# Patient Record
Sex: Male | Born: 1935 | Race: White | Hispanic: No | Marital: Single | Smoking: Former smoker
Health system: Southern US, Community
[De-identification: ages and names within clinical notes are randomized; demographics above are authoritative.]

## PROBLEM LIST (undated history)

## (undated) DIAGNOSIS — N400 Enlarged prostate without lower urinary tract symptoms: Secondary | ICD-10-CM

## (undated) DIAGNOSIS — R0989 Other specified symptoms and signs involving the circulatory and respiratory systems: Secondary | ICD-10-CM

## (undated) DIAGNOSIS — M1990 Unspecified osteoarthritis, unspecified site: Secondary | ICD-10-CM

## (undated) DIAGNOSIS — I10 Essential (primary) hypertension: Secondary | ICD-10-CM

## (undated) DIAGNOSIS — H9190 Unspecified hearing loss, unspecified ear: Secondary | ICD-10-CM

## (undated) DIAGNOSIS — R351 Nocturia: Secondary | ICD-10-CM

## (undated) DIAGNOSIS — G4730 Sleep apnea, unspecified: Secondary | ICD-10-CM

## (undated) HISTORY — PX: PROSTATE BIOPSY: SHX241

## (undated) HISTORY — PX: COLONOSCOPY: SHX174

---

## 2011-12-29 ENCOUNTER — Other Ambulatory Visit: Payer: Self-pay | Admitting: Orthopedic Surgery

## 2011-12-29 MED ORDER — BUPIVACAINE 0.25 % ON-Q PUMP SINGLE CATH 300ML: 300.0000 mL | INJECTION | Status: DC

## 2011-12-29 MED ORDER — DEXAMETHASONE SODIUM PHOSPHATE 10 MG/ML IJ SOLN: 10.0000 mg | Freq: Once | INTRAMUSCULAR | Status: DC

## 2012-03-02 ENCOUNTER — Encounter (HOSPITAL_COMMUNITY): Payer: Self-pay | Admitting: Pharmacy Technician

## 2012-03-08 ENCOUNTER — Encounter (HOSPITAL_COMMUNITY)
Admission: RE | Admit: 2012-03-08 | Discharge: 2012-03-08 | Disposition: A | Discharge status: Home / Self Care | Payer: Medicare Other | Source: Ambulatory Visit | Attending: Orthopedic Surgery | Admitting: Orthopedic Surgery

## 2012-03-08 ENCOUNTER — Ambulatory Visit (HOSPITAL_COMMUNITY)
Admission: RE | Admit: 2012-03-08 | Discharge: 2012-03-08 | Disposition: A | Discharge status: Home / Self Care | Payer: Medicare Other | Source: Ambulatory Visit | Attending: Orthopedic Surgery | Admitting: Orthopedic Surgery

## 2012-03-08 ENCOUNTER — Encounter (HOSPITAL_COMMUNITY): Payer: Self-pay

## 2012-03-08 DIAGNOSIS — Z01812 Encounter for preprocedural laboratory examination: Secondary | ICD-10-CM | POA: Insufficient documentation

## 2012-03-08 DIAGNOSIS — Z01818 Encounter for other preprocedural examination: Secondary | ICD-10-CM | POA: Insufficient documentation

## 2012-03-08 DIAGNOSIS — I10 Essential (primary) hypertension: Secondary | ICD-10-CM | POA: Insufficient documentation

## 2012-03-08 DIAGNOSIS — E119 Type 2 diabetes mellitus without complications: Secondary | ICD-10-CM | POA: Insufficient documentation

## 2012-03-08 HISTORY — DX: Sleep apnea, unspecified: G47.30

## 2012-03-08 HISTORY — DX: Other specified symptoms and signs involving the circulatory and respiratory systems: R09.89

## 2012-03-08 HISTORY — DX: Unspecified hearing loss, unspecified ear: H91.90

## 2012-03-08 HISTORY — DX: Unspecified osteoarthritis, unspecified site: M19.90

## 2012-03-08 HISTORY — DX: Essential (primary) hypertension: I10

## 2012-03-08 HISTORY — DX: Benign prostatic hyperplasia without lower urinary tract symptoms: N40.0

## 2012-03-08 HISTORY — DX: Nocturia: R35.1

## 2012-03-08 LAB — COMPREHENSIVE METABOLIC PANEL
ALT: 18 U/L (ref 0–53)
AST: 27 U/L (ref 0–37)
Albumin: 4.2 g/dL (ref 3.5–5.2)
Alkaline Phosphatase: 80 U/L (ref 39–117)
BUN: 12 mg/dL (ref 6–23)
CO2: 26 mEq/L (ref 19–32)
Calcium: 9.7 mg/dL (ref 8.4–10.5)
Chloride: 95 mEq/L — ABNORMAL LOW (ref 96–112)
Creatinine, Ser: 0.86 mg/dL (ref 0.50–1.35)
GFR calc Af Amer: >90 mL/min (ref >90)
GFR calc non Af Amer: 84 mL/min — ABNORMAL LOW (ref >90)
Glucose, Bld: 110 mg/dL — ABNORMAL HIGH (ref 70–99)
Potassium: 4.6 mEq/L (ref 3.5–5.1)
Sodium: 131 mEq/L — ABNORMAL LOW (ref 135–145)
Total Bilirubin: 0.3 mg/dL (ref 0.3–1.2)
Total Protein: 8 g/dL (ref 6.0–8.3)

## 2012-03-08 LAB — APTT: aPTT: 34 seconds (ref 24–37)

## 2012-03-08 LAB — CBC
HCT: 38.5 % — ABNORMAL LOW (ref 39.0–52.0)
Hemoglobin: 13.2 g/dL (ref 13.0–17.0)
MCV: 93.7 fL (ref 78.0–100.0)
RDW: 12.6 % (ref 11.5–15.5)
WBC: 6.7 10*3/uL (ref 4.0–10.5)

## 2012-03-08 LAB — URINALYSIS, ROUTINE W REFLEX MICROSCOPIC
Bilirubin Urine: NEGATIVE
Hgb urine dipstick: NEGATIVE
Ketones, ur: NEGATIVE mg/dL
Nitrite: NEGATIVE
Protein, ur: NEGATIVE mg/dL
Urobilinogen, UA: 0.2 mg/dL (ref 0.0–1.0)

## 2012-03-08 LAB — PROTIME-INR
INR: 1.06 (ref 0.00–1.49)
Prothrombin Time: 14 seconds (ref 11.6–15.2)

## 2012-03-08 MED ORDER — CHLORHEXIDINE GLUCONATE 4 % EX LIQD
60.0000 mL | Freq: Once | CUTANEOUS | Status: DC
  Filled 2012-03-08: qty 60

## 2012-03-08 NOTE — Patient Instructions (Addendum)
20 Derrick Figueroa  03/08/2012   Your procedure is scheduled on:  03/19/12 at 12:30pm  Report to SHORT STAY DEPT  at 10:00 AM.  Call this number if you have problems the morning of surgery: [phone]   Remember:   Do not eat food or drink liquids AFTER MIDNIGHT  May have clear liquids UNTIL 6 HOURS BEFORE SURGERY (6:30 am)  Clear liquids include soda, tea, black coffee, apple or grape juice, broth.  Take these medicines the morning of surgery with A SIP OF WATER:NONE (TAKE NIGHT TIME MEDS THE NIGHT BEFORE SURGERY AS USUAL)    Do not wear jewelry, make-up or nail polish.  Do not wear lotions, powders, or perfumes.   Do not shave legs or underarms 48 hrs. before surgery (men may shave face)  Do not bring valuables to the hospital.  Contacts, dentures or bridgework may not be worn into surgery.  Leave suitcase in the car. After surgery it may be brought to your room.  For patients admitted to the hospital, checkout time is 11:00 AM the day of discharge.   Patients discharged the day of surgery will not be allowed to drive home.    Special Instructions:   Please read over the following fact sheets that you were given: MRSA  Information / Incentive Spirometer               SHOWER WITH BETASEPT THE NIGHT BEFORE SURGERY AND THE MORNING OF SURGERY

## 2012-03-08 NOTE — Progress Notes (Signed)
03/08/12 1041  OBSTRUCTIVE SLEEP APNEA  Have you ever been diagnosed with sleep apnea through a sleep study? No  Do you snore loudly (loud enough to be heard through closed doors)?  0  Do you often feel tired, fatigued, or sleepy during the daytime? 1  Has anyone observed you stop breathing during your sleep? 0  Do you have, or are you being treated for high blood pressure? 1  BMI more than 35 kg/m2? 0  Age over 74 years old? 1  Neck circumference greater than 40 cm/18 inches? 1  Gender: 1  Obstructive Sleep Apnea Score 5   Score 4 or greater  Updated health history;Results sent to PCP

## 2012-03-18 ENCOUNTER — Other Ambulatory Visit: Payer: Self-pay | Admitting: Orthopedic Surgery

## 2012-03-18 NOTE — H&P (Signed)
Derrick Figueroa  DOB: 03/19/1936 Single / Language: [place] / Race: White / Male  Date of Admission:  03/19/2012  Chief Complaint:  Right Knee Pain  History of Present Illness The patient is a 76 year old male who comes in for a preoperative History and Physical. The patient is scheduled for a right total knee arthroplasty to be performed by Dr. Gus Rankin. [name], MD at [facility] on 03/19/2012. The patient is a 76 year old male who presents today for follow up of their knee. The patient is being followed for their bilateral knee pain. They are now 4 year(s) out from when symptoms began. Symptoms reported today include: pain and swelling. The patient indicates that they have questions or concerns today regarding surgery. Derrick Figueroa was seen for a second opinion about 2 years ago. He had significant arthritis at that time, but his symptoms were minimal so we held off on anything. Unfortunately, his symptoms are much worse now. His pain is occurring at all times. The right knee is far worse than the left. It is limiting what he can and cannot do. Pain is nearly constant. Occasionally he will get swelling. The knee does not give out on him. He's not getting lower extremity weakness or paresthesias with it. The left knee hurts also but to a lesser extent. He is ready to get the right knee replaced. They have been treated conservatively in the past for the above stated problem and despite conservative measures, they continue to have progressive pain and severe functional limitations and dysfunction. They have failed non-operative management. It is felt that they would benefit from undergoing total joint replacement. Risks and benefits of the procedure have been discussed with the patient and they elect to proceed with surgery. There are no active contraindications to surgery such as ongoing infection or rapidly progressive neurological disease.   Allergies BEE STINGS.  06/30/2010 SULFA. 06/30/2010   Family History Cancer. mother and father Hypertension. mother Father. Deceased, Lung Cancer. age 57 Mother. Deceased, Cancer. age 29   Social History Alcohol use. current drinker; drinks beer; 15 or more per week current drinker; drinks beer; 5 to 7 per day Pateint states that he has stopped drinking on several occassions and did not have any problems in the past and has never had any withdrawal symptoms before. Children. 2 Current work status. working part time retired Financial planner (Currently). no Drug/Alcohol Rehab (Previously). no Exercise. Exercises daily; does running / walking Exercises rarely; does running / walking Illicit drug use. no Living situation. live alone Marital status. single Number of flights of stairs before winded. 1 2-3 Pain Contract. no Tobacco / smoke exposure. no Tobacco use. former smoker; smoke(d) 1 1/2 pack(s) per day; uses less than half 1/2 can(s) smokeless per week Post-Surgical Plans. Plan to look into [place] after the hospital stay.   Medication History Lisinopril (20MG  Tablet, Oral) Active. AmLODIPine Besylate (5MG  Tablet, Oral) Active. Atenolol (25MG  Tablet, Oral) Active. Terazosin HCl (5MG  Capsule, Oral) Active. Meloxicam ( Oral) Specific dose unknown - Active. Loratadine (10MG  Tablet, Oral) Active. Fluticasone Propionate ( Nasal) Specific dose unknown - Active. Betamethasone Dipropionate Aug ( External) Specific dose unknown - Active.   Past Surgical History Prostate Biopsy. 3 times (all benign)   Past Medical History High blood pressure Prostate Disease. BPH Impaired Hearing. Left hearing aide Diet-Controlled Diabetes Mellitus   Review of Systems General:Not Present- Chills, Fever, Night Sweats, Appetite Loss, Fatigue, Feeling sick, Weight Gain and Weight Loss. Skin:Not Present-  Itching, Rash, Skin Color Changes, Ulcer, Psoriasis and Change in Hair or  Nails. HEENT:Not Present- Sensitivity to light, Hearing problems, Nose Bleed and Ringing in the Ears. Neck:Not Present- Swollen Glands and Neck Mass. Respiratory:Not Present- Snoring, Chronic Cough, Bloody sputum and Dyspnea. Cardiovascular:Not Present- Shortness of Breath, Chest Pain, Swelling of Extremities, Leg Cramps and Palpitations. Gastrointestinal:Not Present- Bloody Stool, Heartburn, Abdominal Pain, Vomiting, Nausea and Incontinence of Stool. Male Genitourinary:Not Present- Blood in Urine, Frequency, Incontinence and Nocturia. Musculoskeletal:Present- Joint Swelling and Joint Pain. Not Present- Muscle Weakness, Muscle Pain, Joint Stiffness and Back Pain. Neurological:Not Present- Tingling, Numbness, Burning, Tremor, Headaches and Dizziness. Psychiatric:Not Present- Anxiety, Depression and Memory Loss. Endocrine:Not Present- Cold Intolerance, Heat Intolerance, Excessive hunger and Excessive Thirst. Hematology:Not Present- Abnormal Bleeding, Anemia, Blood Clots and Easy Bruising.   Vitals Weight: 202 lb Height: 69 in Body Surface Area: 2.11 m Body Mass Index: 29.83 kg/m Pulse: 72 (Regular) Resp.: 14 (Unlabored) BP: 150/62 (Sitting, Right Arm, Standard)   Physical Exam The physical exam findings are as follows:   General Mental Status - Alert, cooperative and good historian. General Appearance- pleasant. Not in acute distress. Orientation- Oriented X3. Build & Nutrition- Well nourished and Well developed.   Head and Neck Head- normocephalic, atraumatic . Neck Global Assessment- supple. no bruit auscultated on the right and no bruit auscultated on the left.   Eye Pupil- Bilateral- Regular and Round. Motion- Bilateral- EOMI. wears glasses part of the time, mostly reading  Chest and Lung Exam Auscultation: Breath sounds:- clear at anterior chest wall and - clear at posterior chest wall. Adventitious sounds:- No Adventitious  sounds.   Cardiovascular Auscultation:Rhythm- Regular rate and rhythm. Heart Sounds- S1 WNL and S2 WNL. Murmurs & Other Heart Sounds:Auscultation of the heart reveals - No Murmurs.   Abdomen Inspection:Contour- Generalized moderate distention. Palpation/Percussion:Tenderness- Abdomen is non-tender to palpation. Rigidity (guarding)- Abdomen is soft. Auscultation:Auscultation of the abdomen reveals - Bowel sounds normal.   Male Genitourinary Not done, not pertinent to present illness  Musculoskeletal On exam, he's alert and oriented, in no apparent distress. Right hip has normal motion, no discomfort. Right knee has no effusion. Range is about 5-120. There is marked crepitus on ROM. He's got slight varus. He's tender, medial greater than lateral, with no instability.  RADIOGRAPHS: AP of both knees and lateral show severe end stage arthritis and right knee bone on bone medial and patellofemoral with tibial subluxation and varus deformity. He has left knee arthritis but to a lesser degree.  Assessment & Plan Osteoarthritis Right Knee  Note: Patient is for a Right Total Knee Repalcement by Dr. Lequita Halt.  Patient is a current drinker; drinks beer; 5 to 7 per day. Pateint states that he has stopped drinking on several occassions and did not have any problems in the past and has never had any withdrawal symptoms before.  Plan is to go to [facility] after the hospital stay.  PCP - Dr. Sherryll Burger  Signed electronically by Roberts Gaudy, PA-C

## 2012-03-19 ENCOUNTER — Encounter (HOSPITAL_COMMUNITY): Payer: Self-pay | Admitting: *Deleted

## 2012-03-19 ENCOUNTER — Encounter (HOSPITAL_COMMUNITY)
Admission: RE | Disposition: A | Discharge status: Skilled Nursing Facility | Payer: Self-pay | Source: Ambulatory Visit | Attending: Orthopedic Surgery

## 2012-03-19 ENCOUNTER — Ambulatory Visit (HOSPITAL_COMMUNITY): Payer: Medicare Other | Admitting: *Deleted

## 2012-03-19 ENCOUNTER — Inpatient Hospital Stay (HOSPITAL_COMMUNITY)
Admission: RE | Admit: 2012-03-19 | Discharge: 2012-03-22 | DRG: 470 | Disposition: A | Discharge status: Skilled Nursing Facility | Payer: Medicare Other | Source: Ambulatory Visit | Attending: Orthopedic Surgery | Admitting: Orthopedic Surgery

## 2012-03-19 DIAGNOSIS — I10 Essential (primary) hypertension: Secondary | ICD-10-CM | POA: Diagnosis present

## 2012-03-19 DIAGNOSIS — Z96659 Presence of unspecified artificial knee joint: Secondary | ICD-10-CM

## 2012-03-19 DIAGNOSIS — G4730 Sleep apnea, unspecified: Secondary | ICD-10-CM | POA: Diagnosis present

## 2012-03-19 DIAGNOSIS — M1710 Unilateral primary osteoarthritis, unspecified knee: Principal | ICD-10-CM | POA: Diagnosis present

## 2012-03-19 DIAGNOSIS — E875 Hyperkalemia: Secondary | ICD-10-CM | POA: Diagnosis not present

## 2012-03-19 DIAGNOSIS — E119 Type 2 diabetes mellitus without complications: Secondary | ICD-10-CM | POA: Diagnosis present

## 2012-03-19 DIAGNOSIS — D62 Acute posthemorrhagic anemia: Secondary | ICD-10-CM

## 2012-03-19 DIAGNOSIS — M179 Osteoarthritis of knee, unspecified: Secondary | ICD-10-CM | POA: Diagnosis present

## 2012-03-19 DIAGNOSIS — E871 Hypo-osmolality and hyponatremia: Secondary | ICD-10-CM | POA: Diagnosis not present

## 2012-03-19 HISTORY — PX: TOTAL KNEE ARTHROPLASTY: SHX125

## 2012-03-19 LAB — ABO/RH: ABO/RH(D): O POS

## 2012-03-19 LAB — TYPE AND SCREEN: Antibody Screen: NEGATIVE

## 2012-03-19 SURGERY — ARTHROPLASTY, KNEE, TOTAL
Anesthesia: General | Site: Knee | Laterality: Right | Wound class: Clean

## 2012-03-19 MED ORDER — 0.9 % SODIUM CHLORIDE (POUR BTL) OPTIME
TOPICAL | Status: DC | PRN
  Administered 2012-03-19: 1000 mL

## 2012-03-19 MED ORDER — MORPHINE SULFATE (PF) 1 MG/ML IV SOLN
INTRAVENOUS | Status: AC
  Filled 2012-03-19: qty 25

## 2012-03-19 MED ORDER — ONDANSETRON HCL 4 MG/2ML IJ SOLN: 4.0000 mg | Freq: Four times a day (QID) | INTRAMUSCULAR | Status: DC | PRN

## 2012-03-19 MED ORDER — TRAMADOL HCL 50 MG PO TABS: 50.0000 mg | ORAL_TABLET | Freq: Four times a day (QID) | ORAL | Status: DC | PRN

## 2012-03-19 MED ORDER — DIPHENHYDRAMINE HCL 12.5 MG/5ML PO ELIX
12.5000 mg | ORAL_SOLUTION | ORAL | Status: DC | PRN
  Administered 2012-03-20: 12.5 mg via ORAL
  Filled 2012-03-19: qty 5

## 2012-03-19 MED ORDER — ONDANSETRON HCL 4 MG PO TABS: 4.0000 mg | ORAL_TABLET | Freq: Four times a day (QID) | ORAL | Status: DC | PRN

## 2012-03-19 MED ORDER — HYDROMORPHONE HCL PF 1 MG/ML IJ SOLN
0.2500 mg | INTRAMUSCULAR | Status: DC | PRN
  Administered 2012-03-19 (×3): 0.5 mg via INTRAVENOUS

## 2012-03-19 MED ORDER — SODIUM CHLORIDE 0.9 % IR SOLN
Status: DC | PRN
  Administered 2012-03-19: 1000 mL

## 2012-03-19 MED ORDER — SODIUM CHLORIDE 0.9 % IV SOLN: INTRAVENOUS | Status: DC

## 2012-03-19 MED ORDER — FLEET ENEMA 7-19 GM/118ML RE ENEM: 1.0000 | ENEMA | Freq: Once | RECTAL | Status: AC | PRN

## 2012-03-19 MED ORDER — CEFAZOLIN SODIUM 1-5 GM-% IV SOLN
1.0000 g | Freq: Four times a day (QID) | INTRAVENOUS | Status: AC
  Administered 2012-03-19 – 2012-03-20 (×2): 1 g via INTRAVENOUS
  Filled 2012-03-19 (×2): qty 50

## 2012-03-19 MED ORDER — LIDOCAINE HCL (CARDIAC) 20 MG/ML IV SOLN
INTRAVENOUS | Status: DC | PRN
  Administered 2012-03-19: 100 mg via INTRAVENOUS

## 2012-03-19 MED ORDER — METHOCARBAMOL 100 MG/ML IJ SOLN
500.0000 mg | Freq: Four times a day (QID) | INTRAVENOUS | Status: DC | PRN
  Administered 2012-03-19 – 2012-03-20 (×2): 500 mg via INTRAVENOUS
  Filled 2012-03-19 (×2): qty 5

## 2012-03-19 MED ORDER — ONDANSETRON HCL 4 MG/2ML IJ SOLN
INTRAMUSCULAR | Status: DC | PRN
  Administered 2012-03-19: 4 mg via INTRAVENOUS

## 2012-03-19 MED ORDER — ACETAMINOPHEN 10 MG/ML IV SOLN
INTRAVENOUS | Status: DC | PRN
  Administered 2012-03-19: 1000 mg via INTRAVENOUS

## 2012-03-19 MED ORDER — BUPIVACAINE 0.25 % ON-Q PUMP SINGLE CATH 300ML
INJECTION | Status: AC
  Filled 2012-03-19: qty 300

## 2012-03-19 MED ORDER — HYDROMORPHONE HCL PF 1 MG/ML IJ SOLN
INTRAMUSCULAR | Status: AC
  Filled 2012-03-19: qty 1

## 2012-03-19 MED ORDER — TRIAMCINOLONE ACETONIDE 0.1 % EX CREA
TOPICAL_CREAM | Freq: Two times a day (BID) | CUTANEOUS | Status: DC | PRN
  Filled 2012-03-19: qty 15

## 2012-03-19 MED ORDER — ATENOLOL 25 MG PO TABS
25.0000 mg | ORAL_TABLET | Freq: Every day | ORAL | Status: DC
  Administered 2012-03-19 – 2012-03-22 (×4): 25 mg via ORAL
  Filled 2012-03-19 (×4): qty 1

## 2012-03-19 MED ORDER — ACETAMINOPHEN 10 MG/ML IV SOLN
1000.0000 mg | Freq: Once | INTRAVENOUS | Status: DC
  Filled 2012-03-19: qty 100

## 2012-03-19 MED ORDER — HYDROMORPHONE HCL PF 1 MG/ML IJ SOLN
INTRAMUSCULAR | Status: DC | PRN
  Administered 2012-03-19 (×4): 0.5 mg via INTRAVENOUS

## 2012-03-19 MED ORDER — BUPIVACAINE 0.25 % ON-Q PUMP SINGLE CATH 300ML
INJECTION | Status: DC | PRN
  Administered 2012-03-19: 300 mL

## 2012-03-19 MED ORDER — RIVAROXABAN 10 MG PO TABS
10.0000 mg | ORAL_TABLET | Freq: Every day | ORAL | Status: DC
  Administered 2012-03-20 – 2012-03-22 (×3): 10 mg via ORAL
  Filled 2012-03-19 (×4): qty 1

## 2012-03-19 MED ORDER — MORPHINE SULFATE (PF) 1 MG/ML IV SOLN
INTRAVENOUS | Status: DC
  Administered 2012-03-19 (×2): via INTRAVENOUS
  Administered 2012-03-19: 16 mg via INTRAVENOUS
  Administered 2012-03-20: 10 mg via INTRAVENOUS
  Administered 2012-03-20: 3 mg via INTRAVENOUS
  Filled 2012-03-19: qty 25

## 2012-03-19 MED ORDER — BUPIVACAINE ON-Q PAIN PUMP (FOR ORDER SET NO CHG)
INJECTION | Status: DC
  Filled 2012-03-19: qty 1

## 2012-03-19 MED ORDER — CEFAZOLIN SODIUM-DEXTROSE 2-3 GM-% IV SOLR
2.0000 g | INTRAVENOUS | Status: AC
  Administered 2012-03-19: 2 g via INTRAVENOUS

## 2012-03-19 MED ORDER — OXYCODONE HCL 5 MG PO TABS
5.0000 mg | ORAL_TABLET | ORAL | Status: DC | PRN
  Administered 2012-03-21 – 2012-03-22 (×6): 10 mg via ORAL
  Filled 2012-03-19 (×6): qty 2

## 2012-03-19 MED ORDER — DIPHENHYDRAMINE HCL 12.5 MG/5ML PO ELIX
12.5000 mg | ORAL_SOLUTION | Freq: Four times a day (QID) | ORAL | Status: DC | PRN
  Filled 2012-03-19: qty 5

## 2012-03-19 MED ORDER — METOCLOPRAMIDE HCL 10 MG PO TABS: 5.0000 mg | ORAL_TABLET | Freq: Three times a day (TID) | ORAL | Status: DC | PRN

## 2012-03-19 MED ORDER — TERAZOSIN HCL 5 MG PO CAPS
5.0000 mg | ORAL_CAPSULE | Freq: Every day | ORAL | Status: DC
  Administered 2012-03-19 – 2012-03-21 (×3): 5 mg via ORAL
  Filled 2012-03-19 (×4): qty 1

## 2012-03-19 MED ORDER — SODIUM CHLORIDE 0.9 % IV SOLN
INTRAVENOUS | Status: DC
  Administered 2012-03-19: 23:00:00 via INTRAVENOUS

## 2012-03-19 MED ORDER — FENTANYL CITRATE 0.05 MG/ML IJ SOLN
INTRAMUSCULAR | Status: DC | PRN
  Administered 2012-03-19 (×2): 50 ug via INTRAVENOUS
  Administered 2012-03-19: 150 ug via INTRAVENOUS
  Administered 2012-03-19: 50 ug via INTRAVENOUS
  Administered 2012-03-19 (×2): 25 ug via INTRAVENOUS
  Administered 2012-03-19 (×2): 50 ug via INTRAVENOUS

## 2012-03-19 MED ORDER — MENTHOL 3 MG MT LOZG
1.0000 | LOZENGE | OROMUCOSAL | Status: DC | PRN
  Filled 2012-03-19: qty 9

## 2012-03-19 MED ORDER — POLYETHYLENE GLYCOL 3350 17 G PO PACK: 17.0000 g | PACK | Freq: Every day | ORAL | Status: DC | PRN

## 2012-03-19 MED ORDER — NALOXONE HCL 0.4 MG/ML IJ SOLN: 0.4000 mg | INTRAMUSCULAR | Status: DC | PRN

## 2012-03-19 MED ORDER — FLUTICASONE PROPIONATE 50 MCG/ACT NA SUSP
2.0000 | Freq: Every day | NASAL | Status: DC
  Administered 2012-03-19 – 2012-03-22 (×4): 2 via NASAL
  Filled 2012-03-19: qty 16

## 2012-03-19 MED ORDER — LORAZEPAM 1 MG PO TABS
1.0000 mg | ORAL_TABLET | Freq: Four times a day (QID) | ORAL | Status: DC | PRN
  Administered 2012-03-20: 1 mg via ORAL
  Filled 2012-03-19: qty 1

## 2012-03-19 MED ORDER — SODIUM CHLORIDE 0.9 % IJ SOLN: 9.0000 mL | INTRAMUSCULAR | Status: DC | PRN

## 2012-03-19 MED ORDER — ACETAMINOPHEN 650 MG RE SUPP: 650.0000 mg | Freq: Four times a day (QID) | RECTAL | Status: DC | PRN

## 2012-03-19 MED ORDER — PROPOFOL 10 MG/ML IV EMUL
INTRAVENOUS | Status: DC | PRN
  Administered 2012-03-19: 150 mg via INTRAVENOUS

## 2012-03-19 MED ORDER — PHENOL 1.4 % MT LIQD
1.0000 | OROMUCOSAL | Status: DC | PRN
  Filled 2012-03-19: qty 177

## 2012-03-19 MED ORDER — DIPHENHYDRAMINE HCL 50 MG/ML IJ SOLN: 12.5000 mg | Freq: Four times a day (QID) | INTRAMUSCULAR | Status: DC | PRN

## 2012-03-19 MED ORDER — DEXAMETHASONE SODIUM PHOSPHATE 4 MG/ML IJ SOLN
INTRAMUSCULAR | Status: DC | PRN
  Administered 2012-03-19: 10 mg via INTRAVENOUS

## 2012-03-19 MED ORDER — ACETAMINOPHEN 10 MG/ML IV SOLN
INTRAVENOUS | Status: AC
  Filled 2012-03-19: qty 100

## 2012-03-19 MED ORDER — DOCUSATE SODIUM 100 MG PO CAPS
100.0000 mg | ORAL_CAPSULE | Freq: Two times a day (BID) | ORAL | Status: DC
  Administered 2012-03-19 – 2012-03-22 (×6): 100 mg via ORAL

## 2012-03-19 MED ORDER — METHOCARBAMOL 500 MG PO TABS: 500.0000 mg | ORAL_TABLET | Freq: Four times a day (QID) | ORAL | Status: DC | PRN

## 2012-03-19 MED ORDER — METOCLOPRAMIDE HCL 5 MG/ML IJ SOLN: 5.0000 mg | Freq: Three times a day (TID) | INTRAMUSCULAR | Status: DC | PRN

## 2012-03-19 MED ORDER — AMLODIPINE BESYLATE 5 MG PO TABS
5.0000 mg | ORAL_TABLET | Freq: Every day | ORAL | Status: DC
  Administered 2012-03-19 – 2012-03-22 (×4): 5 mg via ORAL
  Filled 2012-03-19 (×4): qty 1

## 2012-03-19 MED ORDER — BISACODYL 10 MG RE SUPP: 10.0000 mg | Freq: Every day | RECTAL | Status: DC | PRN

## 2012-03-19 MED ORDER — ACETAMINOPHEN 10 MG/ML IV SOLN
1000.0000 mg | Freq: Four times a day (QID) | INTRAVENOUS | Status: AC
  Administered 2012-03-19 – 2012-03-20 (×4): 1000 mg via INTRAVENOUS
  Filled 2012-03-19 (×5): qty 100

## 2012-03-19 MED ORDER — MIDAZOLAM HCL 5 MG/5ML IJ SOLN
INTRAMUSCULAR | Status: DC | PRN
  Administered 2012-03-19: 2 mg via INTRAVENOUS

## 2012-03-19 MED ORDER — LACTATED RINGERS IV SOLN
INTRAVENOUS | Status: DC
  Administered 2012-03-19 (×2): via INTRAVENOUS
  Administered 2012-03-19: 1000 mL via INTRAVENOUS

## 2012-03-19 MED ORDER — SUCCINYLCHOLINE CHLORIDE 20 MG/ML IJ SOLN
INTRAMUSCULAR | Status: DC | PRN
  Administered 2012-03-19: 100 mg via INTRAVENOUS

## 2012-03-19 MED ORDER — CEFAZOLIN SODIUM-DEXTROSE 2-3 GM-% IV SOLR
INTRAVENOUS | Status: AC
  Filled 2012-03-19: qty 50

## 2012-03-19 MED ORDER — ACETAMINOPHEN 325 MG PO TABS: 650.0000 mg | ORAL_TABLET | Freq: Four times a day (QID) | ORAL | Status: DC | PRN

## 2012-03-19 MED ORDER — LORATADINE 10 MG PO TABS
10.0000 mg | ORAL_TABLET | Freq: Every day | ORAL | Status: DC
  Administered 2012-03-19 – 2012-03-22 (×4): 10 mg via ORAL
  Filled 2012-03-19 (×4): qty 1

## 2012-03-19 SURGICAL SUPPLY — 52 items
BAG ZIPLOCK 12X15 (MISCELLANEOUS) ×2 IMPLANT
BANDAGE ELASTIC 6 VELCRO ST LF (GAUZE/BANDAGES/DRESSINGS) ×2 IMPLANT
BANDAGE ESMARK 6X9 LF (GAUZE/BANDAGES/DRESSINGS) ×1 IMPLANT
BLADE SAG 18X100X1.27 (BLADE) ×2 IMPLANT
BLADE SAW SGTL 11.0X1.19X90.0M (BLADE) ×2 IMPLANT
BNDG ESMARK 6X9 LF (GAUZE/BANDAGES/DRESSINGS) ×2
BOWL SMART MIX CTS (DISPOSABLE) ×2 IMPLANT
CATH KIT ON-Q SILVERSOAK 5IN (CATHETERS) ×2 IMPLANT
CEMENT HV SMART SET (Cement) ×4 IMPLANT
CLOTH BEACON ORANGE TIMEOUT ST (SAFETY) ×2 IMPLANT
CUFF TOURN SGL QUICK 34 (TOURNIQUET CUFF) ×1
CUFF TRNQT CYL 34X4X40X1 (TOURNIQUET CUFF) ×1 IMPLANT
DRAPE EXTREMITY T 121X128X90 (DRAPE) ×2 IMPLANT
DRAPE POUCH INSTRU U-SHP 10X18 (DRAPES) ×2 IMPLANT
DRAPE U-SHAPE 47X51 STRL (DRAPES) ×2 IMPLANT
DRSG ADAPTIC 3X8 NADH LF (GAUZE/BANDAGES/DRESSINGS) ×2 IMPLANT
DRSG PAD ABDOMINAL 8X10 ST (GAUZE/BANDAGES/DRESSINGS) ×2 IMPLANT
DURAPREP 26ML APPLICATOR (WOUND CARE) ×2 IMPLANT
ELECT REM PT RETURN 9FT ADLT (ELECTROSURGICAL) ×2
ELECTRODE REM PT RTRN 9FT ADLT (ELECTROSURGICAL) ×1 IMPLANT
EVACUATOR 1/8 PVC DRAIN (DRAIN) ×2 IMPLANT
FACESHIELD LNG OPTICON STERILE (SAFETY) ×10 IMPLANT
GLOVE BIO SURGEON STRL SZ7.5 (GLOVE) ×4 IMPLANT
GLOVE BIO SURGEON STRL SZ8 (GLOVE) ×2 IMPLANT
GLOVE BIOGEL PI IND STRL 8 (GLOVE) ×3 IMPLANT
GLOVE BIOGEL PI INDICATOR 8 (GLOVE) ×3
GOWN STRL NON-REIN LRG LVL3 (GOWN DISPOSABLE) ×2 IMPLANT
GOWN STRL REIN XL XLG (GOWN DISPOSABLE) ×4 IMPLANT
GRAFT IC CHAMBER 5CC (Bone Implant) ×2 IMPLANT
HANDPIECE INTERPULSE COAX TIP (DISPOSABLE) ×1
IMMOBILIZER KNEE 20 (SOFTGOODS) ×6
IMMOBILIZER KNEE 20 THIGH 36 (SOFTGOODS) ×3 IMPLANT
KIT BASIN OR (CUSTOM PROCEDURE TRAY) ×2 IMPLANT
MANIFOLD NEPTUNE II (INSTRUMENTS) ×2 IMPLANT
NS IRRIG 1000ML POUR BTL (IV SOLUTION) ×2 IMPLANT
PACK TOTAL JOINT (CUSTOM PROCEDURE TRAY) ×2 IMPLANT
PAD ABD 7.5X8 STRL (GAUZE/BANDAGES/DRESSINGS) IMPLANT
PADDING CAST COTTON 6X4 STRL (CAST SUPPLIES) ×4 IMPLANT
POSITIONER SURGICAL ARM (MISCELLANEOUS) ×2 IMPLANT
SET HNDPC FAN SPRY TIP SCT (DISPOSABLE) ×1 IMPLANT
SPONGE GAUZE 4X4 12PLY (GAUZE/BANDAGES/DRESSINGS) ×2 IMPLANT
STRIP CLOSURE SKIN 1/2X4 (GAUZE/BANDAGES/DRESSINGS) ×2 IMPLANT
SUCTION FRAZIER 12FR DISP (SUCTIONS) ×2 IMPLANT
SUT MNCRL AB 4-0 PS2 18 (SUTURE) ×2 IMPLANT
SUT PDS AB 1 CT1 27 (SUTURE) ×6 IMPLANT
SUT VIC AB 2-0 CT1 27 (SUTURE) ×3
SUT VIC AB 2-0 CT1 TAPERPNT 27 (SUTURE) ×3 IMPLANT
SUT VLOC 180 0 24IN GS25 (SUTURE) ×2 IMPLANT
TOWEL OR 17X26 10 PK STRL BLUE (TOWEL DISPOSABLE) ×4 IMPLANT
TRAY FOLEY CATH 14FRSI W/METER (CATHETERS) ×2 IMPLANT
WATER STERILE IRR 1500ML POUR (IV SOLUTION) ×2 IMPLANT
WRAP KNEE MAXI GEL POST OP (GAUZE/BANDAGES/DRESSINGS) ×2 IMPLANT

## 2012-03-19 NOTE — Anesthesia Preprocedure Evaluation (Signed)
Anesthesia Evaluation  Patient identified by MRN, date of birth, ID band Patient awake    Reviewed: Allergy & Precautions, H&P , NPO status , Patient's Chart, lab work & pertinent test results, reviewed documented beta blocker date and time   Airway Mallampati: II TM Distance: >3 FB Neck ROM: Full    Dental  (+) Teeth Intact and Dental Advisory Given   Pulmonary neg pulmonary ROS,  breath sounds clear to auscultation        Cardiovascular hypertension, Pt. on medications Rate:Normal  Denies cardiac symptoms   Neuro/Psych negative neurological ROS  negative psych ROS   GI/Hepatic negative GI ROS, Neg liver ROS,   Endo/Other  Diet controlled  Renal/GU negative Renal ROS  negative genitourinary   Musculoskeletal   Abdominal   Peds negative pediatric ROS (+)  Hematology negative hematology ROS (+)   Anesthesia Other Findings   Reproductive/Obstetrics negative OB ROS                           Anesthesia Physical Anesthesia Plan  ASA: III  Anesthesia Plan: General   Post-op Pain Management:    Induction: Intravenous  Airway Management Planned: Oral ETT  Additional Equipment:   Intra-op Plan:   Post-operative Plan: Extubation in OR  Informed Consent: I have reviewed the patients History and Physical, chart, labs and discussed the procedure including the risks, benefits and alternatives for the proposed anesthesia with the patient or authorized representative who has indicated his/her understanding and acceptance.   Dental advisory given  Plan Discussed with: CRNA and Surgeon  Anesthesia Plan Comments:         Anesthesia Quick Evaluation

## 2012-03-19 NOTE — Transfer of Care (Signed)
Immediate Anesthesia Transfer of Care Note  Patient: Derrick Figueroa  Procedure(s) Performed: Procedure(s) (LRB): TOTAL KNEE ARTHROPLASTY (Right)  Patient Location: PACU  Anesthesia Type: General  Level of Consciousness: awake, alert  and oriented  Airway & Oxygen Therapy: Patient Spontanous Breathing and Patient connected to nasal cannula oxygen  Post-op Assessment: Report given to PACU RN and Post -op Vital signs reviewed and stable  Post vital signs: Reviewed and stable  Complications: No apparent anesthesia complications

## 2012-03-19 NOTE — Op Note (Signed)
Pre-operative diagnosis- Osteoarthritis  Right knee(s)  Post-operative diagnosis- Osteoarthritis Right knee(s)  Procedure-  Right  Total Knee Arthroplasty  Surgeon- Gus Rankin. Haani Bakula, MD  Assistant- Avel Peace, PA-C   Anesthesia-  General EBL-* No blood loss amount entered *  Drains Hemovac  Tourniquet time-  Total Tourniquet Time Documented: Thigh (Right) - 37 minutes   Complications- None  Condition-PACU - hemodynamically stable.   Brief Clinical Note  Derrick Figueroa is a 76 y.o. year old male with end stage OA of his right knee with progressively worsening pain and dysfunction. He has constant pain, with activity and at rest and significant functional deficits with difficulties even with ADLs. He has had extensive non-op management including analgesics, injections of cortisone and viscosupplements, and home exercise program, but remains in significant pain with significant dysfunction. Radiographs show bone on bone arthritis medial and patellofemoral with tibial subluxation. He presents now for right Total Knee Arthroplasty.    Procedure in detail---   The patient is brought into the operating room and positioned supine on the operating table. After successful administration of  General,   a tourniquet is placed high on the  Right thigh(s) and the lower extremity is prepped and draped in the usual sterile fashion. Time out is performed by the operating team and then the  Right lower extremity is wrapped in Esmarch, knee flexed and the tourniquet inflated to 300 mmHg.       A midline incision is made with a ten blade through the subcutaneous tissue to the level of the extensor mechanism. A fresh blade is used to make a medial parapatellar arthrotomy. Soft tissue over the proximal medial tibia is subperiosteally elevated to the joint line with a knife and into the semimembranosus bursa with a Cobb elevator. Soft tissue over the proximal lateral tibia is elevated with attention being paid to  avoiding the patellar tendon on the tibial tubercle. The patella is everted, knee flexed 90 degrees and the ACL and PCL are removed. Findings are bone on bone medial and patellofemoral with large medial osteophytes.        The drill is used to create a starting hole in the distal femur and the canal is thoroughly irrigated with sterile saline to remove the fatty contents. The 5 degree Right  valgus alignment guide is placed into the femoral canal and the distal femoral cutting block is pinned to remove 10 mm off the distal femur. Resection is made with an oscillating saw.      The tibia is subluxed forward and the menisci are removed. The extramedullary alignment guide is placed referencing proximally at the medial aspect of the tibial tubercle and distally along the second metatarsal axis and tibial crest. The block is pinned to remove 2mm off the more deficient medial  side. Resection is made with an oscillating saw. Size 4is the most appropriate size for the tibia and the proximal tibia is prepared with the modular drill and keel punch for that size.      The femoral sizing guide is placed and size 4 narrow is most appropriate. Rotation is marked off the epicondylar axis and confirmed by creating a rectangular flexion gap at 90 degrees. The size 4 cutting block is pinned in this rotation and the anterior, posterior and chamfer cuts are made with the oscillating saw. The intercondylar block is then placed and that cut is made.      Trial size 4 tibial component, trial size 4 narrow posterior stabilized femur  and a 10  mm posterior stabilized rotating platform insert trial is placed. Full extension is achieved with excellent varus/valgus and anterior/posterior balance throughout full range of motion. The patella is everted and thickness measured to be 24  mm. Free hand resection is taken to 14 mm, a 38 template is placed, lug holes are drilled, trial patella is placed, and it tracks normally. Osteophytes are  removed off the posterior femur with the trial in place. All trials are removed and the cut bone surfaces prepared with pulsatile lavage. Cement is mixed and once ready for implantation, the size 4 tibial implant, size  4 narrow posterior stabilized femoral component, and the size 38 patella are cemented in place and the patella is held with the clamp. The trial insert is placed and the knee held in full extension. All extruded cement is removed and once the cement is hard the permanent 10 mm posterior stabilized rotating platform insert is placed into the tibial tray.      The wound is copiously irrigated with saline solution and the extensor mechanism closed over a hemovac drain with #1 PDS suture. The tourniquet is released for a total tourniquet time of 37  minutes. Flexion against gravity is 140 degrees and the patella tracks normally. Subcutaneous tissue is closed with 2.0 vicryl and subcuticular with running 4.0 Monocryl. The catheter for the Marcaine pain pump is placed and the pump is initiated. The incision is cleaned and dried and steri-strips and a bulky sterile dressing are applied. The limb is placed into a knee immobilizer and the patient is awakened and transported to recovery in stable condition.      Please note that a surgical assistant was a medical necessity for this procedure in order to perform it in a safe and expeditious manner. Surgical assistant was necessary to retract the ligaments and vital neurovascular structures to prevent injury to them and also necessary for proper positioning of the limb to allow for anatomic placement of the prosthesis.   Gus Rankin [name], MD    03/19/2012, 2:01 PM

## 2012-03-19 NOTE — Interval H&P Note (Signed)
History and Physical Interval Note:  03/19/2012 12:45 PM  Derrick Figueroa  has presented today for surgery, with the diagnosis of osteoarthritis right knee   The various methods of treatment have been discussed with the patient and family. After consideration of risks, benefits and other options for treatment, the patient has consented to  Procedure(s) (LRB): TOTAL KNEE ARTHROPLASTY (Right) as a surgical intervention .  The patient's history has been reviewed, patient examined, no change in status, stable for surgery.  I have reviewed the patients' chart and labs.  Questions were answered to the patient's satisfaction.     Loanne Drilling

## 2012-03-19 NOTE — Anesthesia Postprocedure Evaluation (Signed)
Anesthesia Post Note  Patient: Derrick Figueroa  Procedure(s) Performed: Procedure(s) (LRB): TOTAL KNEE ARTHROPLASTY (Right)  Anesthesia type: General  Patient location: PACU  Post pain: Pain level controlled  Post assessment: Post-op Vital signs reviewed  Last Vitals:  Filed Vitals:   03/19/12 1430  BP: 192/81  Pulse: 94  Temp: 36.7 C  Resp: 13    Post vital signs: Reviewed  Level of consciousness: sedated  Complications: No apparent anesthesia complications

## 2012-03-19 NOTE — Preoperative (Signed)
Beta Blockers   Reason not to administer Beta Blockers:Not Applicable 

## 2012-03-19 NOTE — H&P (View-Only) (Signed)
Derrick Figueroa  DOB: 09/06/1936 Single / Language: English / Race: White / Male  Date of Admission:  03/19/2012  Chief Complaint:  Right Knee Pain  History of Present Illness The patient is a 75 year old male who comes in for a preoperative History and Physical. The patient is scheduled for a right total knee arthroplasty to be performed by Dr. Frank V. [name], MD at [facility] on 03/19/2012. The patient is a 75 year old male who presents today for follow up of their knee. The patient is being followed for their bilateral knee pain. They are now 4 year(s) out from when symptoms began. Symptoms reported today include: pain and swelling. The patient indicates that they have questions or concerns today regarding surgery. Derrick Figueroa was seen for a second opinion about 2 years ago. He had significant arthritis at that time, but his symptoms were minimal so we held off on anything. Unfortunately, his symptoms are much worse now. His pain is occurring at all times. The right knee is far worse than the left. It is limiting what he can and cannot do. Pain is nearly constant. Occasionally he will get swelling. The knee does not give out on him. He's not getting lower extremity weakness or paresthesias with it. The left knee hurts also but to a lesser extent. He is ready to get the right knee replaced. They have been treated conservatively in the past for the above stated problem and despite conservative measures, they continue to have progressive pain and severe functional limitations and dysfunction. They have failed non-operative management. It is felt that they would benefit from undergoing total joint replacement. Risks and benefits of the procedure have been discussed with the patient and they elect to proceed with surgery. There are no active contraindications to surgery such as ongoing infection or rapidly progressive neurological disease.   Allergies BEE STINGS.  06/30/2010 SULFA. 06/30/2010   Family History Cancer. mother and father Hypertension. mother Father. Deceased, Lung Cancer. age 65 Mother. Deceased, Cancer. age 82   Social History Alcohol use. current drinker; drinks beer; 15 or more per week current drinker; drinks beer; 5 to 7 per day Pateint states that he has stopped drinking on several occassions and did not have any problems in the past and has never had any withdrawal symptoms before. Children. 2 Current work status. working part time retired Drug/Alcohol Rehab (Currently). no Drug/Alcohol Rehab (Previously). no Exercise. Exercises daily; does running / walking Exercises rarely; does running / walking Illicit drug use. no Living situation. live alone Marital status. single Number of flights of stairs before winded. 1 2-3 Pain Contract. no Tobacco / smoke exposure. no Tobacco use. former smoker; smoke(d) 1 1/2 pack(s) per day; uses less than half 1/2 can(s) smokeless per week Post-Surgical Plans. Plan to look into [facility] after the hospital stay.   Medication History Lisinopril (20MG Tablet, Oral) Active. AmLODIPine Besylate (5MG Tablet, Oral) Active. Atenolol (25MG Tablet, Oral) Active. Terazosin HCl (5MG Capsule, Oral) Active. Meloxicam ( Oral) Specific dose unknown - Active. Loratadine (10MG Tablet, Oral) Active. Fluticasone Propionate ( Nasal) Specific dose unknown - Active. Betamethasone Dipropionate Aug ( External) Specific dose unknown - Active.   Past Surgical History Prostate Biopsy. 3 times (all benign)   Past Medical History High blood pressure Prostate Disease. BPH Impaired Hearing. Left hearing aide Diet-Controlled Diabetes Mellitus   Review of Systems General:Not Present- Chills, Fever, Night Sweats, Appetite Loss, Fatigue, Feeling sick, Weight Gain and Weight Loss. Skin:Not Present-   Itching, Rash, Skin Color Changes, Ulcer, Psoriasis and Change in Hair or  Nails. HEENT:Not Present- Sensitivity to light, Hearing problems, Nose Bleed and Ringing in the Ears. Neck:Not Present- Swollen Glands and Neck Mass. Respiratory:Not Present- Snoring, Chronic Cough, Bloody sputum and Dyspnea. Cardiovascular:Not Present- Shortness of Breath, Chest Pain, Swelling of Extremities, Leg Cramps and Palpitations. Gastrointestinal:Not Present- Bloody Stool, Heartburn, Abdominal Pain, Vomiting, Nausea and Incontinence of Stool. Male Genitourinary:Not Present- Blood in Urine, Frequency, Incontinence and Nocturia. Musculoskeletal:Present- Joint Swelling and Joint Pain. Not Present- Muscle Weakness, Muscle Pain, Joint Stiffness and Back Pain. Neurological:Not Present- Tingling, Numbness, Burning, Tremor, Headaches and Dizziness. Psychiatric:Not Present- Anxiety, Depression and Memory Loss. Endocrine:Not Present- Cold Intolerance, Heat Intolerance, Excessive hunger and Excessive Thirst. Hematology:Not Present- Abnormal Bleeding, Anemia, Blood Clots and Easy Bruising.   Vitals Weight: 202 lb Height: 69 in Body Surface Area: 2.11 m Body Mass Index: 29.83 kg/m Pulse: 72 (Regular) Resp.: 14 (Unlabored) BP: 150/62 (Sitting, Right Arm, Standard)   Physical Exam The physical exam findings are as follows:   General Mental Status - Alert, cooperative and good historian. General Appearance- pleasant. Not in acute distress. Orientation- Oriented X3. Build & Nutrition- Well nourished and Well developed.   Head and Neck Head- normocephalic, atraumatic . Neck Global Assessment- supple. no bruit auscultated on the right and no bruit auscultated on the left.   Eye Pupil- Bilateral- Regular and Round. Motion- Bilateral- EOMI. wears glasses part of the time, mostly reading  Chest and Lung Exam Auscultation: Breath sounds:- clear at anterior chest wall and - clear at posterior chest wall. Adventitious sounds:- No Adventitious  sounds.   Cardiovascular Auscultation:Rhythm- Regular rate and rhythm. Heart Sounds- S1 WNL and S2 WNL. Murmurs & Other Heart Sounds:Auscultation of the heart reveals - No Murmurs.   Abdomen Inspection:Contour- Generalized moderate distention. Palpation/Percussion:Tenderness- Abdomen is non-tender to palpation. Rigidity (guarding)- Abdomen is soft. Auscultation:Auscultation of the abdomen reveals - Bowel sounds normal.   Male Genitourinary Not done, not pertinent to present illness  Musculoskeletal On exam, he's alert and oriented, in no apparent distress. Right hip has normal motion, no discomfort. Right knee has no effusion. Range is about 5-120. There is marked crepitus on ROM. He's got slight varus. He's tender, medial greater than lateral, with no instability.  RADIOGRAPHS: AP of both knees and lateral show severe end stage arthritis and right knee bone on bone medial and patellofemoral with tibial subluxation and varus deformity. He has left knee arthritis but to a lesser degree.  Assessment & Plan Osteoarthritis Right Knee  Note: Patient is for a Right Total Knee Repalcement by Dr. [name].  Patient is a current drinker; drinks beer; 5 to 7 per day. Pateint states that he has stopped drinking on several occassions and did not have any problems in the past and has never had any withdrawal symptoms before.  Plan is to go to [facility] after the hospital stay.  PCP - Dr. Shah  Signed electronically by DREW L Benedicto Capozzi, PA-C  

## 2012-03-20 DIAGNOSIS — E875 Hyperkalemia: Secondary | ICD-10-CM | POA: Diagnosis not present

## 2012-03-20 LAB — BASIC METABOLIC PANEL
BUN: 15 mg/dL (ref 6–23)
CO2: 23 mEq/L (ref 19–32)
CO2: 25 mEq/L (ref 19–32)
Chloride: 94 mEq/L — ABNORMAL LOW (ref 96–112)
Chloride: 93 mEq/L — ABNORMAL LOW (ref 96–112)
Creatinine, Ser: 0.95 mg/dL (ref 0.50–1.35)
GFR calc Af Amer: >90 mL/min (ref >90)
Glucose, Bld: 251 mg/dL — ABNORMAL HIGH (ref 70–99)
Glucose, Bld: 201 mg/dL — ABNORMAL HIGH (ref 70–99)
Potassium: 5.4 mEq/L — ABNORMAL HIGH (ref 3.5–5.1)

## 2012-03-20 LAB — CBC
HCT: 29.2 % — ABNORMAL LOW (ref 39.0–52.0)
Hemoglobin: 10.3 g/dL — ABNORMAL LOW (ref 13.0–17.0)
RBC: 3.13 MIL/uL — ABNORMAL LOW (ref 4.22–5.81)
RDW: 12.9 % (ref 11.5–15.5)
WBC: 8 10*3/uL (ref 4.0–10.5)

## 2012-03-20 LAB — GLUCOSE, CAPILLARY

## 2012-03-20 MED ORDER — MORPHINE SULFATE 2 MG/ML IJ SOLN: 1.0000 mg | INTRAMUSCULAR | Status: DC | PRN

## 2012-03-20 MED ORDER — INSULIN ASPART 100 UNIT/ML ~~LOC~~ SOLN: 0.0000 [IU] | Freq: Three times a day (TID) | SUBCUTANEOUS | Status: DC

## 2012-03-20 MED ORDER — FUROSEMIDE 10 MG/ML IJ SOLN
10.0000 mg | Freq: Once | INTRAMUSCULAR | Status: AC
  Administered 2012-03-20: 10 mg via INTRAVENOUS
  Filled 2012-03-20: qty 1

## 2012-03-20 NOTE — Progress Notes (Signed)
Clinical Social Work Department CLINICAL SOCIAL WORK PLACEMENT NOTE 03/20/2012  Patient:  Derrick Figueroa,Derrick Figueroa  Account Number:  [id number] Admit date:  03/19/2012  Clinical Social Worker:  Cori Razor, LCSW  Date/time:  03/20/2012 11:34 AM  Clinical Social Work is seeking post-discharge placement for this patient at the following level of care:   SKILLED NURSING   (*CSW will update this form in Epic as items are completed)   03/20/2012  Patient/family provided with [facility] Department of Clinical Social Work's list of facilities offering this level of care within the geographic area requested by the patient (or if unable, by the patient's family).  03/20/2012  Patient/family informed of their freedom to choose among providers that offer the needed level of care, that participate in Medicare, Medicaid or managed care program needed by the patient, have an available bed and are willing to accept the patient.    Patient/family informed of [facility]' ownership interest in [facility], as well as of the fact that they are under no obligation to receive care at this facility.  PASARR submitted to EDS on 03/20/2012 PASARR number received from EDS on 03/20/2012  FL2 transmitted to all facilities in geographic area requested by pt/family on  03/20/2012 FL2 transmitted to all facilities within larger geographic area on   Patient informed that his/her managed care company has contracts with or will negotiate with  certain facilities, including the following:     Patient/family informed of bed offers received:   Patient chooses bed at  Physician recommends and patient chooses bed at    Patient to be transferred to  on   Patient to be transferred to facility by   The following physician request were entered in Epic:   Additional Comments:  Cori Razor LCSW [phone]

## 2012-03-20 NOTE — Evaluation (Signed)
Physical Therapy Evaluation Patient Details Name: Derrick Figueroa MRN: [id number] DOB: Dec 09, 1935 Today's Date: 03/20/2012 Time: 7829-5621 PT Time Calculation (min): 14 min  PT Assessment / Plan / Recommendation Clinical Impression  76 yo male s/p R TKA. Mobilizing fairly well. Pt plans to d/c to SNF for rehab.     PT Assessment  Patient needs continued PT services    Follow Up Recommendations  Skilled nursing facility    Barriers to Discharge        Equipment Recommendations  Defer to next venue    Recommendations for Other Services OT consult   Frequency 7X/week    Precautions / Restrictions Precautions Precautions: Fall Restrictions Weight Bearing Restrictions: No RLE Weight Bearing: Weight bearing as tolerated   Pertinent Vitals/Pain       Mobility  Bed Mobility Bed Mobility: Not assessed Details for Bed Mobility Assistance: pt sitting on bsc at start of session Transfers Transfers: Sit to Stand;Stand to Sit Sit to Stand: 4: Min assist;From chair/3-in-1;With upper extremity assist;With armrests Stand to Sit: 4: Min assist;To chair/3-in-1;With upper extremity assist;With armrests Details for Transfer Assistance: VCS safety, technique, hand placement. Assist to rise, stabilize, control descent.  Ambulation/Gait Ambulation/Gait Assistance: 4: Min assist Ambulation Distance (Feet): 70 Feet Assistive device: Rolling walker Ambulation/Gait Assistance Details: VCS safety, technique, sequence, distance from RW. Slow gait speed.  Gait Pattern: Antalgic;Step-to pattern;Decreased stride length;Decreased step length - right;Decreased step length - left    Exercises     PT Diagnosis: Difficulty walking;Acute pain;Abnormality of gait  PT Problem List: Decreased strength;Decreased range of motion;Decreased activity tolerance;Decreased mobility;Pain;Decreased knowledge of use of DME;Decreased knowledge of precautions PT Treatment Interventions: DME instruction;Gait  training;Functional mobility training;Therapeutic activities;Therapeutic exercise;Patient/family education   PT Goals Acute Rehab PT Goals PT Goal Formulation: With patient Time For Goal Achievement: 03/27/12 Potential to Achieve Goals: Good Pt will go Supine/Side to Sit: with supervision PT Goal: Supine/Side to Sit - Progress: Goal set today Pt will go Sit to Supine/Side: with supervision PT Goal: Sit to Supine/Side - Progress: Goal set today Pt will go Sit to Stand: with supervision PT Goal: Sit to Stand - Progress: Goal set today Pt will Ambulate: 51 - 150 feet;with supervision;with least restrictive assistive device PT Goal: Ambulate - Progress: Goal set today Pt will Perform Home Exercise Program: with supervision, verbal cues required/provided PT Goal: Perform Home Exercise Program - Progress: Goal set today  Visit Information  Last PT Received On: 03/20/12 Assistance Needed: +1    Subjective Data  Subjective: "I go at a slow pace, so if you all want to go faster, then just go ahead" Patient Stated Goal: Walk   Prior Functioning  Home Living Lives With: Alone Additional Comments: Planning to d/c to SNF for rehab Prior Function Level of Independence: Independent Communication Communication: No difficulties    Cognition  Overall Cognitive Status: Appears within functional limits for tasks assessed/performed Arousal/Alertness: Awake/alert Orientation Level: Appears intact for tasks assessed Behavior During Session: [facility] for tasks performed    Extremity/Trunk Assessment Right Lower Extremity Assessment RLE ROM/Strength/Tone: Deficits RLE ROM/Strength/Tone Deficits: SLR 2/5, moves ankle well RLE Sensation: WFL - Light Touch Left Lower Extremity Assessment LLE ROM/Strength/Tone: WFL for tasks assessed LLE Sensation: WFL - Light Touch Trunk Assessment Trunk Assessment: Normal   Balance    End of Session PT - End of Session Equipment Utilized During Treatment: Gait  belt Activity Tolerance: Patient tolerated treatment well Patient left: in chair;with call bell/phone within reach CPM Right Knee CPM Right Knee:  Off  GP     [name] [facility] 03/20/2012, 10:49 AM [phone]

## 2012-03-20 NOTE — Progress Notes (Signed)
Clinical Social Work Department BRIEF PSYCHOSOCIAL ASSESSMENT 03/20/2012  Patient:  Derrick Figueroa,Derrick Figueroa     Account Number:  [id number]     Admit date:  03/19/2012  Clinical Social Worker:  Candie Chroman  Date/Time:  03/20/2012 11:19 AM  Referred by:  Physician  Date Referred:  03/20/2012 Referred for  SNF Placement   Other Referral:   Interview type:  Patient Other interview type:    PSYCHOSOCIAL DATA Living Status:  ALONE Admitted from facility:   Level of care:   Primary support name:  Arrie Senate Primary support relationship to patient:  CHILD, ADULT Degree of support available:   supportive    CURRENT CONCERNS Current Concerns  Post-Acute Placement   Other Concerns:    SOCIAL WORK ASSESSMENT / PLAN Pt is a 76 yr old gentleman living at home, alone, prior to hospitalization. CSW met with pt to assist with d/c planning. Pt would like to have ST rehab at [facility] following hospital d/c. SNF contacted to check bed availability. Admissions Coordinator will contact CSW Wed. with admission decision. Pt is aware and agreeable with [facility] search in case there is no opening at [facility] at time of d/c. Bed offers will be provided as received. Message left for pt's son to contact CSW so d/c planning update can be provided. Unable to reach pt's daughter at number listed on demo sheet.   Assessment/plan status:  Psychosocial Support/Ongoing Assessment of Needs Other assessment/ plan:   Information/referral to community resources:   SNF list provided    PATIENT'S/FAMILY'S RESPONSE TO PLAN OF CARE: Pt would like to have his rehab at [facility]. Pt's daughter is a Engineer, civil (consulting) at that facility.    Cori Razor LCSW [phone]

## 2012-03-20 NOTE — Anesthesia Postprocedure Evaluation (Signed)
  Anesthesia Post-op Note  Patient: Derrick Figueroa  Procedure(s) Performed: Procedure(s) (LRB): TOTAL KNEE ARTHROPLASTY (Right)  Patient Location: PACU  Anesthesia Type: General  Level of Consciousness: oriented and sedated  Airway and Oxygen Therapy: Patient Spontanous Breathing and Patient connected to nasal cannula oxygen  Post-op Pain: mild  Post-op Assessment: Post-op Vital signs reviewed, Patient's Cardiovascular Status Stable, Respiratory Function Stable and Patent Airway  Post-op Vital Signs: stable  Complications: No apparent anesthesia complications

## 2012-03-20 NOTE — Progress Notes (Signed)
Physical Therapy Treatment Patient Details Name: Niv Darley MRN: [id number] DOB: 1936/08/30 Today's Date: 03/20/2012 Time: 1400-1436 PT Time Calculation (min): 36 min  PT Assessment / Plan / Recommendation Comments on Treatment Session  Progressing well with mobility.    Follow Up Recommendations  Skilled nursing facility    Barriers to Discharge        Equipment Recommendations  Defer to next venue    Recommendations for Other Services    Frequency 7X/week   Plan Discharge plan remains appropriate    Precautions / Restrictions Precautions Precautions: Fall Restrictions Weight Bearing Restrictions: No RLE Weight Bearing: Weight bearing as tolerated   Pertinent Vitals/Pain     Mobility  Bed Mobility Bed Mobility: Sit to Supine Sit to Supine: 4: Min assist Details for Bed Mobility Assistance: Assist for R LE onto bed.  Transfers Transfers: Sit to Stand;Stand to Sit Sit to Stand: 4: Min assist;With upper extremity assist;From chair/3-in-1 Stand to Sit: 4: Min assist;With upper extremity assist;To bed Details for Transfer Assistance: VCs safety, technique, hand placement. Assist to rise, stabilize, control descent.  Ambulation/Gait Ambulation/Gait Assistance: 4: Min assist Ambulation Distance (Feet): 100 Feet Assistive device: Rolling walker Ambulation/Gait Assistance Details: VCs safety, technique, sequence. Pt somewhat shaky.  Gait Pattern: Step-to pattern;Step-through pattern;Antalgic    Exercises Total Joint Exercises Ankle Circles/Pumps: AROM;Both;10 reps;Supine Quad Sets: AROM;Both;10 reps;Supine;Strengthening Short Arc Quad: AROM;Right;10 reps;Supine;Strengthening Heel Slides: AAROM;Right;10 reps;Supine;Strengthening Hip ABduction/ADduction: AROM;Right;10 reps;Supine;Strengthening Straight Leg Raises: AROM;Right;10 reps;Supine;Strengthening   PT Diagnosis:    PT Problem List:   PT Treatment Interventions:     PT Goals Acute Rehab PT Goals PT Goal: Sit  to Supine/Side - Progress: Progressing toward goal PT Goal: Sit to Stand - Progress: Progressing toward goal PT Goal: Ambulate - Progress: Progressing toward goal PT Goal: Perform Home Exercise Program - Progress: Progressing toward goal  Visit Information  Last PT Received On: 03/20/12 Assistance Needed: +1    Subjective Data  Subjective: "I think I'm doing okay" Patient Stated Goal: get better   Cognition  Overall Cognitive Status: Appears within functional limits for tasks assessed/performed Arousal/Alertness: Awake/alert Orientation Level: Appears intact for tasks assessed Behavior During Session: [facility] for tasks performed    Balance     End of Session PT - End of Session Equipment Utilized During Treatment: Gait belt Activity Tolerance: Patient tolerated treatment well Patient left: in bed;with call bell/phone within reach   GP     [name] [place] 03/20/2012, 3:22 PM [phone]

## 2012-03-20 NOTE — Progress Notes (Signed)
   Subjective: 1 Day Post-Op Procedure(s) (LRB): TOTAL KNEE ARTHROPLASTY (Right) Patient reports pain as mild.   Patient seen in rounds with Dr. Lequita Halt.  Sleeping well but awakens easily. Patient is well, but has had some minor complaints of itching but took Benadryl. We will start therapy today.  Plan is to go Skilled nursing facility after hospital stay.  Wants to look into [facility].  Objective: Vital signs in last 24 hours: Temp:  [96.6 F (35.9 C)-98.2 F (36.8 C)] 97.8 F (36.6 C) (07/09 0520) Pulse Rate:  [56-96] 56  (07/09 0155) Resp:  [10-19] 14  (07/09 0520) BP: (105-192)/(63-81) 118/69 mmHg (07/09 0520) SpO2:  [92 %-100 %] 98 % (07/09 0520) Weight:  [90.266 kg (199 lb)] 90.266 kg (199 lb) (07/08 1545)  Intake/Output from previous day:  Intake/Output Summary (Last 24 hours) at 03/20/12 0759 Last data filed at 03/20/12 0651  Gross per 24 hour  Intake 5132.75 ml  Output   2495 ml  Net 2637.75 ml    Intake/Output this shift:    Labs:  Basename 03/20/12 0435  HGB 10.3*    Basename 03/20/12 0435  WBC 8.0  RBC 3.13*  HCT 29.2*  PLT 173    Basename 03/20/12 0435  NA 125*  K 5.4*  CL 94*  CO2 23  BUN 15  CREATININE 0.93  GLUCOSE 251*  CALCIUM 8.2*   No results found for this basename: LABPT:2,INR:2 in the last 72 hours  EXAM General - Patient is Alert, Appropriate and Oriented Extremity - Neurovascular intact Sensation intact distally Dorsiflexion/Plantar flexion intact Dressing - dressing C/D/I Motor Function - intact, moving foot and toes well on exam.  Hemovac pulled without difficulty.  Past Medical History  Diagnosis Date  . Hypertension Norvasc (parameters), Tenormin, Lisinopril on Hold  . Sinus complaint   . Arthritis   . Enlarged prostate Hytrin  . Nocturia   . Diabetes mellitus Add SSI for postop use.    diet controlled  . Hearing difficulty   . Sleep apnea     STOP BANG SCORE 5    Assessment/Plan: 1 Day Post-Op  Procedure(s) (LRB): TOTAL KNEE ARTHROPLASTY (Right) Principal Problem:  *OA (osteoarthritis) of knee Active Problems:  Postop Hyperkalemia  Will give lasix this morning and recheck K+ level. Advance diet Up with therapy Continue foley due to strict I&O and urinary output monitoring Discharge to SNF - [facility]  DVT Prophylaxis - Xarelto Weight-Bearing as tolerated to right leg Keep foley until tomorrow. No vaccines. D/C PCA Morphine, Change to IV push D/C O2 and Pulse OX and try on Room Air  Patient is a current drinker; drinks beer; 5 to 7 per day. Pateint states that he has stopped drinking on several occassions and did not have any problems in the past and has never had any withdrawal symptoms before. PRN Ativan has been ordered.  PERKINS, ALEXZANDREW 03/20/2012, 7:59 AM

## 2012-03-20 NOTE — Progress Notes (Signed)
Utilization review completed.  

## 2012-03-21 ENCOUNTER — Encounter (HOSPITAL_COMMUNITY): Payer: Self-pay | Admitting: Orthopedic Surgery

## 2012-03-21 DIAGNOSIS — E871 Hypo-osmolality and hyponatremia: Secondary | ICD-10-CM | POA: Diagnosis not present

## 2012-03-21 LAB — BASIC METABOLIC PANEL
Chloride: 98 mEq/L (ref 96–112)
GFR calc Af Amer: >90 mL/min (ref >90)
GFR calc non Af Amer: 85 mL/min — ABNORMAL LOW (ref >90)
Glucose, Bld: 138 mg/dL — ABNORMAL HIGH (ref 70–99)
Potassium: 4.1 mEq/L (ref 3.5–5.1)
Sodium: 133 mEq/L — ABNORMAL LOW (ref 135–145)

## 2012-03-21 LAB — GLUCOSE, CAPILLARY
Glucose-Capillary: 142 mg/dL — ABNORMAL HIGH (ref 70–99)
Glucose-Capillary: 128 mg/dL — ABNORMAL HIGH (ref 70–99)
Glucose-Capillary: 122 mg/dL — ABNORMAL HIGH (ref 70–99)

## 2012-03-21 LAB — CBC
Hemoglobin: 10 g/dL — ABNORMAL LOW (ref 13.0–17.0)
MCHC: 34.5 g/dL (ref 30.0–36.0)
WBC: 7.6 10*3/uL (ref 4.0–10.5)

## 2012-03-21 NOTE — Progress Notes (Signed)
Physical Therapy Treatment Patient Details Name: Derrick Figueroa MRN: [id number] DOB: 1935-10-28 Today's Date: 03/21/2012 Time: 6213-0865 PT Time Calculation (min): 27 min  PT Assessment / Plan / Recommendation Comments on Treatment Session  Progressing well. Some difficulty with knee extension during gait. Practiced quad sets once back to bed. Also placed pilllow under calf to encourage knee extension. Instructed pt to perform 10 quad sets/hr, if able.     Follow Up Recommendations  Skilled nursing facility    Barriers to Discharge        Equipment Recommendations  Defer to next venue    Recommendations for Other Services    Frequency 7X/week   Plan Discharge plan remains appropriate    Precautions / Restrictions Precautions Precautions: Fall Restrictions Weight Bearing Restrictions: No RLE Weight Bearing: Weight bearing as tolerated   Pertinent Vitals/Pain     Mobility  Bed Mobility Bed Mobility: Sit to Supine Sit to Supine: 4: Min assist;HOB flat Details for Bed Mobility Assistance: Assist for R LE onto bed.  Transfers Transfers: Sit to Stand;Stand to Sit Sit to Stand: 4: Min guard;With upper extremity assist;From chair/3-in-1;From toilet Stand to Sit: 4: Min guard;With upper extremity assist;To toilet;To bed Details for Transfer Assistance: VCS safety, technique, hand placement.  Ambulation/Gait Ambulation/Gait Assistance: 4: Min assist Ambulation Distance (Feet): 140 Feet Assistive device: Rolling walker Ambulation/Gait Assistance Details: Assist to stabilize intermittently. VCS safety, sequence, step lenth, heel strike/flat foot with initial contact. Pt having difficulty getting good knee extension in stance phase.  Gait Pattern: Step-through pattern;Antalgic;Right flexed knee in stance    Exercises Total Joint Exercises Ankle Circles/Pumps: AROM;Both;10 reps;Supine Quad Sets: AROM;Strengthening;Right;10 reps;Supine Short Arc Quad: AROM;Strengthening;Right;10  reps;Supine Heel Slides: AAROM;Strengthening;Right;Seated;5 reps Hip ABduction/ADduction: AROM;Strengthening;Right;10 reps;Supine Straight Leg Raises: AROM;Strengthening;Right;10 reps;Supine   PT Diagnosis:    PT Problem List:   PT Treatment Interventions:     PT Goals Acute Rehab PT Goals PT Goal: Sit to Supine/Side - Progress: Progressing toward goal PT Goal: Sit to Stand - Progress: Progressing toward goal PT Goal: Ambulate - Progress: Progressing toward goal PT Goal: Perform Home Exercise Program - Progress: Progressing toward goal  Visit Information  Last PT Received On: 03/21/12 Assistance Needed: +1    Subjective Data  Subjective: "It feel better already" Patient Stated Goal: work on straightening knee   Cognition  Overall Cognitive Status: Appears within functional limits for tasks assessed/performed Arousal/Alertness: Awake/alert Orientation Level: Appears intact for tasks assessed Behavior During Session: [facility] for tasks performed    Balance     End of Session PT - End of Session Equipment Utilized During Treatment: Gait belt Activity Tolerance: Patient tolerated treatment well Patient left: in bed;with call bell/phone within reach   GP     [name] [place] 03/21/2012, 2:48 PM [phone]

## 2012-03-21 NOTE — Progress Notes (Signed)
   Subjective: 2 Days Post-Op Procedure(s) (LRB): TOTAL KNEE ARTHROPLASTY (Right) Patient reports pain as mild.   Patient seen in rounds with Dr. Lequita Halt. Patient is well, and has had no acute complaints or problems Plan is to go Skilled nursing facility after hospital stay. Wants to look into [facility].   Objective: Vital signs in last 24 hours: Temp:  [97.8 F (36.6 C)-98.2 F (36.8 C)] 97.8 F (36.6 C) (07/10 0601) Pulse Rate:  [76-84] 76  (07/10 0601) Resp:  [16-18] 17  (07/10 0736) BP: (129-165)/(68-81) 165/71 mmHg (07/10 0927) SpO2:  [95 %-98 %] 95 % (07/10 0601)  Intake/Output from previous day:  Intake/Output Summary (Last 24 hours) at 03/21/12 1013 Last data filed at 03/21/12 0845  Gross per 24 hour  Intake    960 ml  Output   5775 ml  Net  -4815 ml    Intake/Output this shift: Total I/O In: 240 [P.O.:240] Out: 475 [Urine:475]  Labs:  [facility] 03/21/12 0452 03/20/12 0435  HGB 10.0* 10.3*    Basename 03/21/12 0452 03/20/12 0435  WBC 7.6 8.0  RBC 3.10* 3.13*  HCT 29.0* 29.2*  PLT 169 173    Basename 03/21/12 0452 03/20/12 1359  NA 133* 126*  K 4.1 4.6  CL 98 93*  CO2 27 25  BUN 13 18  CREATININE 0.83 0.95  GLUCOSE 138* 201*  CALCIUM 8.5 8.5   No results found for this basename: LABPT:2,INR:2 in the last 72 hours  EXAM General - Patient is Alert, Appropriate and Oriented Extremity - Neurovascular intact Sensation intact distally Dorsiflexion/Plantar flexion intact No cellulitis present Dressing/Incision - clean, dry, no drainage, healing Motor Function - intact, moving foot and toes well on exam.   Past Medical History  Diagnosis Date  . Hypertension   . Sinus complaint   . Arthritis   . Enlarged prostate   . Nocturia   . Diabetes mellitus     diet controlled  . Hearing difficulty   . Sleep apnea     STOP BANG SCORE 5    Assessment/Plan: 2 Days Post-Op Procedure(s) (LRB): TOTAL KNEE ARTHROPLASTY (Right) Principal Problem:  *OA (osteoarthritis) of knee Active Problems:  Postop Hyperkalemia  Postop Hyponatremia   Up with therapy Plan for discharge tomorrow Discharge to SNF  DVT Prophylaxis - Xarelto Weight-Bearing as tolerated to right leg  Patient is a current drinker; drinks beer; 5 to 7 per day. Pateint states that he has stopped drinking on several occassions and did not have any problems in the past and has never had any withdrawal symptoms before.  PRN Ativan has been ordered.  PERKINS, ALEXZANDREW 03/21/2012, 10:13 AM

## 2012-03-21 NOTE — Progress Notes (Signed)
CSW assisting with d/c planning. Pt has accepted a ST SNF bed at [facility]. SNF bed is available Thurs if pt is stable for d/c. CSW will continue to follow to assist with d/c planning to SNF.  Cori Razor LCSW [phone]

## 2012-03-21 NOTE — Progress Notes (Signed)
Order received, chart reviewed, noted plan is for pt to go to SNF. Will defer OT eval to that facility. Acute OT will sign off.  Ignacia Palma, OTR/L [phone] 03/21/2012

## 2012-03-21 NOTE — Progress Notes (Signed)
Physical Therapy Treatment Patient Details Name: Derrick Figueroa MRN: [id number] DOB: Dec 08, 1935 Today's Date: 03/21/2012 Time: 1030-1055 PT Time Calculation (min): 25 min  PT Assessment / Plan / Recommendation Comments on Treatment Session  Continuing to progress well.     Follow Up Recommendations  Skilled nursing facility    Barriers to Discharge        Equipment Recommendations  Defer to next venue    Recommendations for Other Services    Frequency 7X/week   Plan Discharge plan remains appropriate    Precautions / Restrictions Precautions Precautions: Fall Restrictions Weight Bearing Restrictions: No RLE Weight Bearing: Weight bearing as tolerated   Pertinent Vitals/Pain     Mobility  Bed Mobility Bed Mobility: Not assessed Transfers Transfers: Sit to Stand;Stand to Sit Sit to Stand: 4: Min assist;From chair/3-in-1;With upper extremity assist;With armrests Stand to Sit: With armrests;With upper extremity assist;4: Min assist;To chair/3-in-1 Details for Transfer Assistance: VCS safety, technique, hand placement. Assist to rise, stabilize, control descent.  Ambulation/Gait Ambulation/Gait Assistance: 4: Min assist Ambulation Distance (Feet): 140 Feet Assistive device: Rolling walker Ambulation/Gait Assistance Details: Assist to stabilize intermittently. VCS safety, sequence, step length. Good gait speed. Gait Pattern: Step-to pattern;Step-through pattern;Antalgic    Exercises Total Joint Exercises Ankle Circles/Pumps: AROM;Both;10 reps;Supine Quad Sets: AROM;Both;10 reps;Supine;Strengthening Short Arc Quad: AROM;Strengthening;Right;10 reps;Supine Heel Slides: AAROM;Strengthening;Right;Seated;5 reps Hip ABduction/ADduction: AROM;Strengthening;Right;10 reps;Supine Straight Leg Raises: AROM;Strengthening;Right;10 reps;Supine   PT Diagnosis:    PT Problem List:   PT Treatment Interventions:     PT Goals Acute Rehab PT Goals PT Goal: Sit to Stand - Progress:  Progressing toward goal PT Goal: Ambulate - Progress: Progressing toward goal PT Goal: Perform Home Exercise Program - Progress: Progressing toward goal  Visit Information  Last PT Received On: 03/21/12 Assistance Needed: +1    Subjective Data  Subjective: "It's hurting quite a bit now" Patient Stated Goal: Get better   Cognition  Overall Cognitive Status: Appears within functional limits for tasks assessed/performed Arousal/Alertness: Awake/alert Orientation Level: Appears intact for tasks assessed Behavior During Session: [facility] for tasks performed    Balance     End of Session PT - End of Session Equipment Utilized During Treatment: Gait belt;Right knee immobilizer Activity Tolerance: Patient tolerated treatment well Patient left: in chair;with call bell/phone within reach   GP     Derrick Figueroa 03/21/2012, 11:30 AM [phone]

## 2012-03-22 DIAGNOSIS — D62 Acute posthemorrhagic anemia: Secondary | ICD-10-CM

## 2012-03-22 LAB — BASIC METABOLIC PANEL
CO2: 28 mEq/L (ref 19–32)
GFR calc Af Amer: >90 mL/min (ref >90)

## 2012-03-22 LAB — CBC
HCT: 27.7 % — ABNORMAL LOW (ref 39.0–52.0)
Hemoglobin: 9.4 g/dL — ABNORMAL LOW (ref 13.0–17.0)
MCH: 32.4 pg (ref 26.0–34.0)
MCHC: 33.9 g/dL (ref 30.0–36.0)

## 2012-03-22 MED ORDER — ACETAMINOPHEN 325 MG PO TABS: 650.0000 mg | ORAL_TABLET | Freq: Four times a day (QID) | ORAL | Status: AC | PRN

## 2012-03-22 MED ORDER — DSS 100 MG PO CAPS: 100.0000 mg | ORAL_CAPSULE | Freq: Two times a day (BID) | ORAL | Status: AC

## 2012-03-22 MED ORDER — METHOCARBAMOL 500 MG PO TABS: 500.0000 mg | ORAL_TABLET | Freq: Four times a day (QID) | ORAL | Status: AC | PRN

## 2012-03-22 MED ORDER — RIVAROXABAN 10 MG PO TABS: 10.0000 mg | ORAL_TABLET | Freq: Every day | ORAL | Status: DC

## 2012-03-22 MED ORDER — OXYCODONE HCL 5 MG PO TABS: 5.0000 mg | ORAL_TABLET | ORAL | Status: AC | PRN

## 2012-03-22 MED ORDER — POLYETHYLENE GLYCOL 3350 17 G PO PACK: 17.0000 g | PACK | Freq: Every day | ORAL | Status: AC | PRN

## 2012-03-22 MED ORDER — BISACODYL 10 MG RE SUPP: 10.0000 mg | Freq: Every day | RECTAL | Status: AC | PRN

## 2012-03-22 MED ORDER — TRAMADOL HCL 50 MG PO TABS: 50.0000 mg | ORAL_TABLET | Freq: Four times a day (QID) | ORAL | Status: AC | PRN

## 2012-03-22 MED ORDER — ONDANSETRON HCL 4 MG PO TABS: 4.0000 mg | ORAL_TABLET | Freq: Four times a day (QID) | ORAL | Status: AC | PRN

## 2012-03-22 NOTE — Progress Notes (Signed)
Pt is ready for d/c at [facility]. Gave the nurse report. Pt is leaving by non-emergency ambulance. No has no signs or symptoms of distress.

## 2012-03-22 NOTE — Progress Notes (Signed)
   Subjective: 3 Days Post-Op Procedure(s) (LRB): TOTAL KNEE ARTHROPLASTY (Right) Patient reports pain as mild.   Patient seen in rounds by Dr. Lequita Halt. Patient is well, and has had no acute complaints or problems Patient is ready to go to rehab at [facility]  Objective: Vital signs in last 24 hours: Temp:  [98.6 F (37 C)-98.8 F (37.1 C)] 98.6 F (37 C) (07/11 0439) Pulse Rate:  [76-86] 86  (07/11 0439) Resp:  [14-18] 17  (07/11 0726) BP: (140-165)/(70-76) 149/71 mmHg (07/11 0439) SpO2:  [93 %-98 %] 93 % (07/11 0439)  Intake/Output from previous day:  Intake/Output Summary (Last 24 hours) at 03/22/12 0727 Last data filed at 03/22/12 0715  Gross per 24 hour  Intake   1080 ml  Output   1576 ml  Net   -496 ml    Intake/Output this shift: Total I/O In: -  Out: 1 [Urine:1]  Labs:  [facility] 03/22/12 0422 03/21/12 0452 03/20/12 0435  HGB 9.4* 10.0* 10.3*    Basename 03/22/12 0422 03/21/12 0452  WBC 5.5 7.6  RBC 2.90* 3.10*  HCT 27.7* 29.0*  PLT 165 169    Basename 03/22/12 0422 03/21/12 0452  NA 130* 133*  K 3.6 4.1  CL 94* 98  CO2 28 27  BUN 12 13  CREATININE 0.88 0.83  GLUCOSE 135* 138*  CALCIUM 8.4 8.5   No results found for this basename: LABPT:2,INR:2 in the last 72 hours  EXAM: General - Patient is Alert, Appropriate and Oriented Extremity - Neurovascular intact Sensation intact distally Dorsiflexion/Plantar flexion intact Incision - clean, dry, no drainage, healing Motor Function - intact, moving foot and toes well on exam.   Assessment/Plan: 3 Days Post-Op Procedure(s) (LRB): TOTAL KNEE ARTHROPLASTY (Right) Procedure(s) (LRB): TOTAL KNEE ARTHROPLASTY (Right) Past Medical History  Diagnosis Date  . Hypertension   . Sinus complaint   . Arthritis   . Enlarged prostate   . Nocturia   . Diabetes mellitus     diet controlled  . Hearing difficulty   . Sleep apnea     STOP BANG SCORE 5   Principal Problem:  *OA (osteoarthritis) of  knee Active Problems:  Postop Hyperkalemia  Postop Hyponatremia  Postop Acute blood loss anemia   Up with therapy Discharge to SNF Diet - Cardiac diet and Diabetic diet Follow up - in 2 weeks Activity - WBAT Disposition - Skilled nursing facility - [facility] Condition Upon Discharge - Good D/C Meds - See DC Summary DVT Prophylaxis - Xarelto  Allisha Harter 03/22/2012, 7:27 AM

## 2012-03-22 NOTE — Discharge Summary (Signed)
Physician Discharge Summary   Patient ID: Derrick Figueroa MRN: [id number] DOB/AGE: [birth year] 76 y.o.  Admit date: 03/19/2012 Discharge date: 03/22/2012  Primary Diagnosis: Osteoarthritis Right Knee  Admission Diagnoses:  Past Medical History  Diagnosis Date  . Hypertension   . Sinus complaint   . Arthritis   . Enlarged prostate   . Nocturia   . Diabetes mellitus     diet controlled  . Hearing difficulty   . Sleep apnea     STOP BANG SCORE 5   Discharge Diagnoses:   Principal Problem:  *OA (osteoarthritis) of knee Active Problems:  Postop Hyperkalemia  Postop Hyponatremia  Postop Acute blood loss anemia  Procedure:  Procedure(s) (LRB): TOTAL KNEE ARTHROPLASTY (Right)   Consults: None  HPI: Derrick Figueroa is a 76 y.o. year old male with end stage OA of his right knee with progressively worsening pain and dysfunction. He has constant pain, with activity and at rest and significant functional deficits with difficulties even with ADLs. He has had extensive non-op management including analgesics, injections of cortisone and viscosupplements, and home exercise program, but remains in significant pain with significant dysfunction. Radiographs show bone on bone arthritis medial and patellofemoral with tibial subluxation. He presents now for right Total Knee Arthroplasty.    Laboratory Data: Hospital Outpatient Visit on 03/08/2012  Component Date Value Range Status  . MRSA, PCR 03/08/2012 NEGATIVE  NEGATIVE Final  . Staphylococcus aureus 03/08/2012 NEGATIVE  NEGATIVE Final   Comment:                                 The Xpert SA Assay (FDA                          approved for NASAL specimens                          only), is one component of                          a comprehensive surveillance                          program.  It is not intended                          to diagnose infection nor to                          guide or monitor treatment.  Marland Kitchen aPTT 03/08/2012 34  24  - 37 seconds Final  . WBC 03/08/2012 6.7  4.0 - 10.5 K/uL Final  . RBC 03/08/2012 4.11* 4.22 - 5.81 MIL/uL Final  . Hemoglobin 03/08/2012 13.2  13.0 - 17.0 g/dL Final  . HCT 40/98/1191 38.5* 39.0 - 52.0 % Final  . MCV 03/08/2012 93.7  78.0 - 100.0 fL Final  . MCH 03/08/2012 32.1  26.0 - 34.0 pg Final  . MCHC 03/08/2012 34.3  30.0 - 36.0 g/dL Final  . RDW 47/82/9562 12.6  11.5 - 15.5 % Final  . Platelets 03/08/2012 225  150 - 400 K/uL Final  . Sodium 03/08/2012 131* 135 - 145 mEq/L Final  . Potassium 03/08/2012 4.6  3.5 - 5.1 mEq/L Final  . Chloride 03/08/2012  95* 96 - 112 mEq/L Final  . CO2 03/08/2012 26  19 - 32 mEq/L Final  . Glucose, Bld 03/08/2012 110* 70 - 99 mg/dL Final  . BUN 16/06/9603 12  6 - 23 mg/dL Final  . Creatinine, Ser 03/08/2012 0.86  0.50 - 1.35 mg/dL Final  . Calcium 54/05/8118 9.7  8.4 - 10.5 mg/dL Final  . Total Protein 03/08/2012 8.0  6.0 - 8.3 g/dL Final  . Albumin 14/78/2956 4.2  3.5 - 5.2 g/dL Final  . AST 21/30/8657 27  0 - 37 U/L Final  . ALT 03/08/2012 18  0 - 53 U/L Final  . Alkaline Phosphatase 03/08/2012 80  39 - 117 U/L Final  . Total Bilirubin 03/08/2012 0.3  0.3 - 1.2 mg/dL Final  . GFR calc non Af Amer 03/08/2012 84* >90 mL/min Final  . GFR calc Af Amer 03/08/2012 >90  >90 mL/min Final   Comment:                                 The eGFR has been calculated                          using the CKD EPI equation.                          This calculation has not been                          validated in all clinical                          situations.                          eGFR's persistently                          <90 mL/min signify                          possible Chronic Kidney Disease.  Marland Kitchen Prothrombin Time 03/08/2012 14.0  11.6 - 15.2 seconds Final  . INR 03/08/2012 1.06  0.00 - 1.49 Final  . Color, Urine 03/08/2012 YELLOW  YELLOW Final  . APPearance 03/08/2012 CLEAR  CLEAR Final  . Specific Gravity, Urine 03/08/2012 1.011  1.005 - 1.030  Final  . pH 03/08/2012 6.5  5.0 - 8.0 Final  . Glucose, UA 03/08/2012 100* NEGATIVE mg/dL Final  . Hgb urine dipstick 03/08/2012 NEGATIVE  NEGATIVE Final  . Bilirubin Urine 03/08/2012 NEGATIVE  NEGATIVE Final  . Ketones, ur 03/08/2012 NEGATIVE  NEGATIVE mg/dL Final  . Protein, ur 84/69/6295 NEGATIVE  NEGATIVE mg/dL Final  . Urobilinogen, UA 03/08/2012 0.2  0.0 - 1.0 mg/dL Final  . Nitrite 28/41/3244 NEGATIVE  NEGATIVE Final  . Leukocytes, UA 03/08/2012 NEGATIVE  NEGATIVE Final   MICROSCOPIC NOT DONE ON URINES WITH NEGATIVE PROTEIN, BLOOD, LEUKOCYTES, NITRITE, OR GLUCOSE <1000 mg/dL.    Basename 03/22/12 0422 03/21/12 0452 03/20/12 0435  HGB 9.4* 10.0* 10.3*    Basename 03/22/12 0422 03/21/12 0452  WBC 5.5 7.6  RBC 2.90* 3.10*  HCT 27.7* 29.0*  PLT 165 169    Basename 03/22/12 0422 03/21/12 0452  NA 130*  133*  K 3.6 4.1  CL 94* 98  CO2 28 27  BUN 12 13  CREATININE 0.88 0.83  GLUCOSE 135* 138*  CALCIUM 8.4 8.5   No results found for this basename: LABPT:2,INR:2 in the last 72 hours  X-Rays:Dg Chest 2 View  03/08/2012  *RADIOLOGY REPORT*  Clinical Data: Preoperative assessment for right total knee replacement, history hypertension, diabetes  CHEST - 2 VIEW  Comparison: None  Findings: Minimal enlargement of cardiac silhouette. Calcified tortuous aorta. Pulmonary vascularity normal. Lungs clear. No pleural effusion or pneumothorax. Minimal levoconvex scoliosis thoracic spine with osseous demineralization.  IMPRESSION: Minimal enlargement of cardiac silhouette. No acute abnormalities.  Original Report Authenticated By: Lollie Marrow, M.D.    EKG: Orders placed during the hospital encounter of 03/08/12  . EKG 12-LEAD  . EKG 12-LEAD     Hospital Course: Patient was admitted to [facility] and taken to the OR and underwent the above state procedure without complications.  Patient tolerated the procedure well and was later transferred to the recovery room and then to the  orthopaedic floor for postoperative care.  They were given PO and IV analgesics for pain control following their surgery.  They were given 24 hours of postoperative antibiotics and started on DVT prophylaxis in the form of Xarelto.   PT and OT were ordered for total joint protocol.  Discharge planning consulted to help with postop disposition and equipment needs.  Patient had a good night on the evening of surgery except for itching and started to get up OOB with therapy on day one.  PCA Morphine was discontinued and they were weaned over to PO meds.  Hemovac drain was pulled without difficulty.  Patient is a current drinker; drinks beer; 5 to 7 per day. Pateint states that he has stopped drinking on several occassions and did not have any problems in the past and has never had any withdrawal symptoms before.  PRN Ativan was ordered.  Continued to work with therapy into day two and was progressing with therapy.  Dressing was changed on day two and the incision was healing well.  By day three, the patient had progressed with therapy and meeting their goals.  Incision was healing well.  Patient was seen in rounds and was ready to go home.  Discharge Medications: Prior to Admission medications   Medication Sig Start Date End Date Taking? Authorizing Provider  acetaminophen (TYLENOL) 325 MG tablet Take 2 tablets (650 mg total) by mouth every 6 (six) hours as needed (or Fever >/= 101). 03/22/12 03/22/13  Alexzandrew Perkins, PA  amLODipine (NORVASC) 5 MG tablet Take 5 mg by mouth daily.    Historical Provider, MD  atenolol (TENORMIN) 25 MG tablet Take 25 mg by mouth daily.    Historical Provider, MD  betamethasone valerate (VALISONE) 0.1 % cream Apply 1 application topically 2 (two) times daily.    Historical Provider, MD  bisacodyl (DULCOLAX) 10 MG suppository Place 1 suppository (10 mg total) rectally daily as needed. 03/22/12 04/01/12  Alexzandrew Perkins, PA  docusate sodium 100 MG CAPS Take 100 mg by mouth 2  (two) times daily. 03/22/12 04/01/12  Alexzandrew Perkins, PA  fluticasone (FLONASE) 50 MCG/ACT nasal spray Place 2 sprays into the nose daily.    Historical Provider, MD  lisinopril (PRINIVIL,ZESTRIL) 20 MG tablet Take 20 mg by mouth 2 (two) times daily.    Historical Provider, MD  loratadine (CLARITIN) 10 MG tablet Take 10 mg by mouth daily.  Historical Provider, MD  methocarbamol (ROBAXIN) 500 MG tablet Take 1 tablet (500 mg total) by mouth every 6 (six) hours as needed. 03/22/12 04/01/12  Alexzandrew Perkins, PA  ondansetron (ZOFRAN) 4 MG tablet Take 1 tablet (4 mg total) by mouth every 6 (six) hours as needed for nausea. 03/22/12 03/29/12  Alexzandrew Perkins, PA  oxyCODONE (OXY IR/ROXICODONE) 5 MG immediate release tablet Take 1-2 tablets (5-10 mg total) by mouth every 4 (four) hours as needed for pain. 03/22/12 04/01/12  Alexzandrew Perkins, PA  polyethylene glycol (MIRALAX / GLYCOLAX) packet Take 17 g by mouth daily as needed. 03/22/12 03/25/12  Alexzandrew Julien Girt, PA  rivaroxaban (XARELTO) 10 MG TABS tablet Take 1 tablet (10 mg total) by mouth daily with breakfast. Take Xarelto for two and a half more weeks, then discontinue Xarelto. 03/22/12   Alexzandrew Julien Girt, PA  terazosin (HYTRIN) 5 MG capsule Take 5 mg by mouth at bedtime.    Historical Provider, MD  traMADol (ULTRAM) 50 MG tablet Take 1-2 tablets (50-100 mg total) by mouth every 6 (six) hours as needed (mild pain). 03/22/12 04/01/12  Alexzandrew Perkins, PA    Diet: Cardiac diet and Diabetic diet Activity:WBAT Follow-up:in 2 weeks Disposition - Skilled nursing facility - [facility] Discharged Condition: good   Discharge Orders    Future Orders Please Complete By Expires   Diet - low sodium heart healthy      Diet Carb Modified      Call MD / Call 911      Comments:   If you experience chest pain or shortness of breath, CALL 911 and be transported to the hospital emergency room.  If you develope a fever above 101 F, pus (white  drainage) or increased drainage or redness at the wound, or calf pain, call your surgeon's office.   Discharge instructions      Comments:   Pick up stool softner and laxative for home. Do not submerge incision under water. May shower. Continue to use ice for pain and swelling from surgery.  Take Xarelto for two and a half more weeks, then discontinue Xarelto.  When discharged from the skilled rehab facility, please have the facility set up the patient's Home Health Physical Therapy prior to being released.  Also provide the patient with their medications at time of release from the facility to include their pain medication, the muscle relaxants, and their blood thinner medication.  If the patient is still at the rehab facility at time of follow up appointment, please also assist the patient in arranging follow up appointment in our office and any transportation needs.   Constipation Prevention      Comments:   Drink plenty of fluids.  Prune juice may be helpful.  You may use a stool softener, such as Colace (over the counter) 100 mg twice a day.  Use MiraLax (over the counter) for constipation as needed.   Increase activity slowly as tolerated      Patient may shower      Comments:   You may shower without a dressing once there is no drainage.  Do not wash over the wound.  If drainage remains, do not shower until drainage stops.   Driving restrictions      Comments:   No driving until released by the physician.   Lifting restrictions      Comments:   No lifting until released by the physician.   TED hose      Comments:   Use stockings (TED hose)  for 3 weeks on both leg(s).  You may remove them at night for sleeping.   Change dressing      Comments:   Change dressing daily with sterile 4 x 4 inch gauze dressing and apply TED hose. Do not submerge the incision under water.   Do not put a pillow under the knee. Place it under the heel.      Do not sit on low chairs, stoools or toilet  seats, as it may be difficult to get up from low surfaces        Medication List  As of 03/22/2012  7:34 AM   STOP taking these medications         calcium carbonate 500 MG chewable tablet      meloxicam 7.5 MG tablet         TAKE these medications         acetaminophen 325 MG tablet   Commonly known as: TYLENOL   Take 2 tablets (650 mg total) by mouth every 6 (six) hours as needed (or Fever >/= 101).      amLODipine 5 MG tablet   Commonly known as: NORVASC   Take 5 mg by mouth daily.      atenolol 25 MG tablet   Commonly known as: TENORMIN   Take 25 mg by mouth daily.      betamethasone valerate 0.1 % cream   Commonly known as: VALISONE   Apply 1 application topically 2 (two) times daily.      bisacodyl 10 MG suppository   Commonly known as: DULCOLAX   Place 1 suppository (10 mg total) rectally daily as needed.      DSS 100 MG Caps   Take 100 mg by mouth 2 (two) times daily.      fluticasone 50 MCG/ACT nasal spray   Commonly known as: FLONASE   Place 2 sprays into the nose daily.      lisinopril 20 MG tablet   Commonly known as: PRINIVIL,ZESTRIL   Take 20 mg by mouth 2 (two) times daily.      loratadine 10 MG tablet   Commonly known as: CLARITIN   Take 10 mg by mouth daily.      methocarbamol 500 MG tablet   Commonly known as: ROBAXIN   Take 1 tablet (500 mg total) by mouth every 6 (six) hours as needed.      ondansetron 4 MG tablet   Commonly known as: ZOFRAN   Take 1 tablet (4 mg total) by mouth every 6 (six) hours as needed for nausea.      oxyCODONE 5 MG immediate release tablet   Commonly known as: Oxy IR/ROXICODONE   Take 1-2 tablets (5-10 mg total) by mouth every 4 (four) hours as needed for pain.      polyethylene glycol packet   Commonly known as: MIRALAX / GLYCOLAX   Take 17 g by mouth daily as needed.      rivaroxaban 10 MG Tabs tablet   Commonly known as: XARELTO   Take 1 tablet (10 mg total) by mouth daily with breakfast. Take Xarelto  for two and a half more weeks, then discontinue Xarelto.      terazosin 5 MG capsule   Commonly known as: HYTRIN   Take 5 mg by mouth at bedtime.      traMADol 50 MG tablet   Commonly known as: ULTRAM   Take 1-2 tablets (50-100 mg total) by mouth every 6 (six) hours as  needed (mild pain).           Follow-up Information    Follow up with Loanne Drilling, MD. Schedule an appointment as soon as possible for a visit in 2 weeks.   Contact information:   [facility] [address] [place] [address] [phone]          Signed: Patrica Duel 03/22/2012, 7:34 AM

## 2012-03-22 NOTE — Progress Notes (Signed)
Physical Therapy Treatment Patient Details Name: Derrick Figueroa MRN: [id number] DOB: 10-16-1935 Today's Date: 03/22/2012 Time: 0840-0900 PT Time Calculation (min): 20 min  PT Assessment / Plan / Recommendation Comments on Treatment Session  DCing to SNF today. Still having some difficulty with R knee extension.     Follow Up Recommendations  Skilled nursing facility    Barriers to Discharge        Equipment Recommendations  Defer to next venue    Recommendations for Other Services    Frequency 7X/week   Plan Discharge plan remains appropriate    Precautions / Restrictions Precautions Precautions: Fall Restrictions Weight Bearing Restrictions: No RLE Weight Bearing: Weight bearing as tolerated DC'd Knee Immobilizer on 03/21/12-pt able to perform SLR.   Pertinent Vitals/Pain     Mobility  Bed Mobility Bed Mobility: Supine to Sit Supine to Sit: 4: Min guard;4: Min assist;HOB elevated;With rails Details for Bed Mobility Assistance: Assist for R LE off bed.  Transfers Transfers: Sit to Stand;Stand to Sit Sit to Stand: 4: Min guard;With upper extremity assist;From bed Stand to Sit: 4: Min guard;To chair/3-in-1;With armrests;With upper extremity assist Details for Transfer Assistance: VCs safety, technique, hand placement. Assist to rise, stabilize.  Ambulation/Gait Ambulation/Gait Assistance: 4: Min guard Ambulation Distance (Feet): 145 Feet Assistive device: Rolling walker Ambulation/Gait Assistance Details: VCs safety, sequence, step length, heel strike/flat foot with initial contact on R side. Good gait speed and stability.  Gait Pattern: Step-through pattern;Right flexed knee in stance;Antalgic    Exercises Total Joint Exercises Ankle Circles/Pumps: AROM;Both;10 reps;Supine Quad Sets: Strengthening;AROM;Right;10 reps;Supine Short Arc Quad: AROM;Right;10 reps;Supine Heel Slides: AAROM;Right;10 reps;Supine Hip ABduction/ADduction: AROM;Right;10 reps;Supine Straight Leg  Raises: Right;10 reps;Supine   PT Diagnosis:    PT Problem List:   PT Treatment Interventions:     PT Goals Acute Rehab PT Goals PT Goal: Supine/Side to Sit - Progress: Progressing toward goal PT Goal: Sit to Stand - Progress: Progressing toward goal PT Goal: Ambulate - Progress: Progressing toward goal PT Goal: Perform Home Exercise Program - Progress: Progressing toward goal  Visit Information  Last PT Received On: 03/22/12 Assistance Needed: +1    Subjective Data  Subjective: "This knee just doesn't want to go straight" Patient Stated Goal: Rehab today. Get R knee straight.   Cognition  Overall Cognitive Status: Appears within functional limits for tasks assessed/performed Arousal/Alertness: Awake/alert Orientation Level: Appears intact for tasks assessed Behavior During Session: [facility] for tasks performed    Balance     End of Session PT - End of Session Equipment Utilized During Treatment: Gait belt Activity Tolerance: Patient tolerated treatment well Patient left: in chair;with call bell/phone within reach CPM Right Knee CPM Right Knee: Off   GP     [name] [facility] 03/22/2012, 9:10 AM [phone]

## 2012-03-23 NOTE — Progress Notes (Signed)
Clinical Social Work Department CLINICAL SOCIAL WORK PLACEMENT NOTE 03/23/2012  Patient:  Derrick Figueroa,Derrick Figueroa  Account Number:  [id number] Admit date:  03/19/2012  Clinical Social Worker:  Cori Razor, LCSW  Date/time:  03/20/2012 11:34 AM  Clinical Social Work is seeking post-discharge placement for this patient at the following level of care:   SKILLED NURSING   (*CSW will update this form in Epic as items are completed)   03/20/2012  Patient/family provided with [facility] Department of Clinical Social Work's list of facilities offering this level of care within the geographic area requested by the patient (or if unable, by the patient's family).  03/20/2012  Patient/family informed of their freedom to choose among providers that offer the needed level of care, that participate in Medicare, Medicaid or managed care program needed by the patient, have an available bed and are willing to accept the patient.    Patient/family informed of [facility]' ownership interest in [facility], as well as of the fact that they are under no obligation to receive care at this facility.  PASARR submitted to EDS on 03/20/2012 PASARR number received from EDS on 03/20/2012  FL2 transmitted to all facilities in geographic area requested by pt/family on  03/20/2012 FL2 transmitted to all facilities within larger geographic area on   Patient informed that his/her managed care company has contracts with or will negotiate with  certain facilities, including the following:     Patient/family informed of bed offers received:  03/21/2012 Patient chooses bed at [facility] Physician recommends and patient chooses bed at    Patient to be transferred to [facility] on  03/22/2012 Patient to be transferred to facility by [facility]  The following physician request were entered in Epic:   Additional Comments:  Cori Razor LCSW [phone]

## 2012-03-23 NOTE — Care Management Note (Signed)
    Page 1 of 2   03/23/2012     9:20:14 AM   CARE MANAGEMENT NOTE 03/23/2012  Patient:  Derrick Figueroa,Derrick Figueroa   Account Number:  [id number]  Date Initiated:  03/22/2012  Documentation initiated by:  Colleen Can  Subjective/Objective Assessment:   dx total knee replacemnt     Action/Plan:   SNF rehab   Anticipated DC Date:     Anticipated DC Plan:  SKILLED NURSING FACILITY  In-house referral  Clinical Social Worker      DC Planning Services  NA      [facility] Choice  NA   Choice offered to / List presented to:  NA   DME arranged  NA      DME agency  NA     HH arranged  NA      HH agency  NA   Status of service:  Completed, signed off Medicare Important Message given?   (If response is "NO", the following Medicare IM given date fields will be blank) Date Medicare IM given:   Date Additional Medicare IM given:    Discharge Disposition:  SKILLED NURSING FACILITY  Per UR Regulation:  Reviewed for med. necessity/level of care/duration of stay  If discussed at Long Length of Stay Meetings, dates discussed:    Comments:

## 2012-07-26 ENCOUNTER — Other Ambulatory Visit: Payer: Self-pay | Admitting: Orthopedic Surgery

## 2012-07-26 MED ORDER — DEXAMETHASONE SODIUM PHOSPHATE 10 MG/ML IJ SOLN: 10.0000 mg | Freq: Once | INTRAMUSCULAR | Status: DC

## 2012-07-26 MED ORDER — BUPIVACAINE 0.25 % ON-Q PUMP SINGLE CATH 300ML: 300.0000 mL | INJECTION | Status: DC

## 2012-07-26 NOTE — Progress Notes (Signed)
Preoperative surgical orders have been place into the Epic hospital system for [facility] on 07/26/2012, 11:29 AM  by Patrica Duel for surgery on 10/01/12.  Preop Total Knee orders including Bupivacaine On-Q pump, IV Tylenol, and IV Decadron as long as there are no contraindications to the above medications. Avel Peace, PA-C

## 2012-09-19 ENCOUNTER — Encounter (HOSPITAL_COMMUNITY): Payer: Self-pay | Admitting: Pharmacy Technician

## 2012-09-24 NOTE — Progress Notes (Addendum)
Office visit note 08/03/12 Dr. Sherryll Burger on chart, stress test 12/03/09 on chart, surgery clearance note Dr. Sherryll Burger on chart, EKG 03/23/12 on EPIC, Chest x-ray 03/08/12 on EPIC

## 2012-09-25 ENCOUNTER — Encounter (HOSPITAL_COMMUNITY)
Admission: RE | Admit: 2012-09-25 | Discharge: 2012-09-25 | Disposition: A | Discharge status: Home / Self Care | Payer: Medicare Other | Source: Ambulatory Visit | Attending: Orthopedic Surgery | Admitting: Orthopedic Surgery

## 2012-09-25 ENCOUNTER — Other Ambulatory Visit (HOSPITAL_COMMUNITY): Payer: Self-pay | Admitting: *Deleted

## 2012-09-25 ENCOUNTER — Encounter (HOSPITAL_COMMUNITY): Payer: Self-pay

## 2012-09-25 LAB — URINALYSIS, ROUTINE W REFLEX MICROSCOPIC
Hgb urine dipstick: NEGATIVE
Leukocytes, UA: NEGATIVE
Specific Gravity, Urine: 1.007 (ref 1.005–1.030)
Urobilinogen, UA: 0.2 mg/dL (ref 0.0–1.0)

## 2012-09-25 LAB — CBC
MCHC: 34.7 g/dL (ref 30.0–36.0)
RDW: 12.4 % (ref 11.5–15.5)

## 2012-09-25 LAB — COMPREHENSIVE METABOLIC PANEL
ALT: 23 U/L (ref 0–53)
Albumin: 3.7 g/dL (ref 3.5–5.2)
Alkaline Phosphatase: 73 U/L (ref 39–117)
BUN: 12 mg/dL (ref 6–23)
Potassium: 4.6 mEq/L (ref 3.5–5.1)
Sodium: 130 mEq/L — ABNORMAL LOW (ref 135–145)
Total Protein: 7.3 g/dL (ref 6.0–8.3)

## 2012-09-25 LAB — APTT: aPTT: 28 seconds (ref 24–37)

## 2012-09-25 LAB — SURGICAL PCR SCREEN: Staphylococcus aureus: NEGATIVE

## 2012-09-25 LAB — PROTIME-INR: Prothrombin Time: 13.4 seconds (ref 11.6–15.2)

## 2012-09-25 NOTE — Progress Notes (Signed)
09/25/12 1139  OBSTRUCTIVE SLEEP APNEA  Have you ever been diagnosed with sleep apnea through a sleep study? No  Do you snore loudly (loud enough to be heard through closed doors)?  0  Do you often feel tired, fatigued, or sleepy during the daytime? 0  Has anyone observed you stop breathing during your sleep? 0  Do you have, or are you being treated for high blood pressure? 1  BMI more than 35 kg/m2? 0  Age over 75 years old? 1  Neck circumference greater than 40 cm/18 inches? 1  Gender: 1  Obstructive Sleep Apnea Score 4   Score 4 or greater  Results sent to PCP

## 2012-09-25 NOTE — Patient Instructions (Signed)
20      Your procedure is scheduled on:  Monday 10/01/2012  Report to [facility] at 0725 AM.  Call this number if you have problems the morning of surgery: [phone]   Remember:             IF YOU USE CPAP,BRING MASK AND TUBING AM OF SURGERY!   Do not eat food or drink liquids AFTER MIDNIGHT!  Take these medicines the morning of surgery with A SIP OF WATER: Claritin, Flonase nasal spray    Do not bring valuables to the hospital.  .  Leave suitcase in the car. After surgery it may be brought to your room.  For patients admitted to the hospital, checkout time is 11:00 AM the day of              Discharge.    DO NOT WEAR JEWELRY , MAKE-UP, LOTIONS,POWDERS,PERFUMES!             WOMEN -DO NOT SHAVE LEGS OR UNDERARMS 12 HRS. BEFORE SURGERY!               MEN MAY SHAVE AS USUAL!             CONTACTS,DENTURES OR BRIDGEWORK, FALSE EYELASHES MAY NOT BE WORN INTO SURGERY!                                           Patients discharged the day of surgery will not be allowed to drive home.If going home the same day of surgery, must have someone stay with youfirst 24 hrs.at home and arrange for someone to drive you home from the              Hospital.              YOUR DRIVER BJ:[id number]   Special Instructions:             Please read over the following fact sheets that you were given:             1. [facility] PREPARING FOR SURGERY SHEET              2.MRSA INFORMATION              3.INCENTIVE SPIROMETRY                                        Telford Nab.Naara Kelty,RN,BSN     [id number]                FAILURE TO FOLLOW THESE INSTRUCTIONS MAY RESULT IN CANCELLATION OF YOUR SURGERY!               Patient Signature:___________________________

## 2012-09-30 ENCOUNTER — Other Ambulatory Visit: Payer: Self-pay | Admitting: Orthopedic Surgery

## 2012-09-30 NOTE — H&P (Signed)
Christianne Dolin  DOB: 05-20-1936 Single / Language: [place] / Race: White Male  Date of Admission:  10/01/2012  Chief complaint:  Left Knee Pain  History of Present Illness The patient is a 77 year old male who comes in for a preoperative History and Physical. The patient is scheduled for a left total knee arthroplasty to be performed by Dr. Gus Rankin. [name], MD at [facility] on 10/01/2012. The patient is a 77 year old male who is post-operative out from right total knee arthroplasty. The patient states that he is doing well at this time. The pain is under excellent control at this time and describe their pain as mild. He is doing well with regards to the right knee. He said the left knee is what is bothering him the most now and he wants to get it fixed. 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 This is the only thing holding him back now. It continues to interfere with his walking and regular activities. He has done well with the right knee and is looking forward to getting the left knee fixed. He is now ready to proceed with surgery. They have been treated conservatively in the past for the above stated problem and despite conservative measures, they continue to have progressive pain and severe functional limitations and dysfunction. They have failed non-operative management including home exercise, medications, and injections. It is felt that they would benefit from undergoing total joint replacement. Risks and benefits of the procedure have been discussed with the patient and they elect to proceed with surgery. There are no active contraindications to surgery such as ongoing infection or rapidly progressive neurological disease.  Problem List Osteoarthritis, Knee (715.96) S/P Right total knee arthroplasty (V43.65)   Allergies SULFA. 06/30/2010 BEE STINGS. 06/30/2010   Family History Cancer. mother and father Hypertension. mother Father.  Deceased, Lung Cancer. age 57 Mother. Deceased, Cancer. age 90   Social History Exercise. Exercises daily; does running / walking Exercises rarely; does running / walking Children. 2 Drug/Alcohol Rehab (Previously). no Drug/Alcohol Rehab (Currently). no Illicit drug use. no Living situation. live alone Current work status. working part time retired Marital status. single Tobacco use. former smoker; smoke(d) 1 1/2 pack(s) per day; uses less than half 1/2 can(s) smokeless per week Number of flights of stairs before winded. 1 2-3 Pain Contract. no Tobacco / smoke exposure. no Alcohol use. current drinker; drinks beer; 15 or more per week current drinker; drinks beer; 5 to 7 per day Pateint states that he has stopped drinking on several occassions and did not have any problems in the past and has never had any withdrawal symptoms before. Post-Surgical Plans. Plan to look into [facility] after the hospital stay. He went there following the previous knee surgery.   Medication History Lisinopril (20MG  Tablet, Oral) Active. AmLODIPine Besylate (5MG  Tablet, Oral) Active. Atenolol (25MG  Tablet, Oral) Active. Terazosin HCl (5MG  Capsule, Oral) Active. Loratadine (10MG  Tablet, Oral) Active. Fluticasone Propionate ( Nasal) Specific dose unknown - Active. Betamethasone Dipropionate Aug ( External) Specific dose unknown - Active. Flomax (0.4MG  Capsule, Oral) Active.   Past Surgical History Prostate Biopsy. 3 times (all benign) Total Knee Replacement - Right. Date: 03/21/2012.   Medical History Diabetes Mellitus, Type II High blood pressure Impaired Hearing. Left hearing aide Prostate Disease. BPH Diet-Controlled Diabetes Mellitus Impaired Vision Hemorrhoids   Review of Systems(DREW L PERKINS, PA-C; 09/11/2012 10:29 AM) General:Not Present- Chills, Fever, Night Sweats, Fatigue, Weight Gain, Weight Loss  and Memory Loss. Skin:Not Present-  Hives, Itching, Rash, Eczema and Lesions. HEENT:Present- Hearing Loss. Not Present- Tinnitus, Headache, Double Vision, Visual Loss and Dentures. Respiratory:Not Present- Shortness of breath with exertion, Shortness of breath at rest, Allergies, Coughing up blood and Chronic Cough. Cardiovascular:Not Present- Chest Pain, Racing/skipping heartbeats, Difficulty Breathing Lying Down, Murmur, Swelling and Palpitations. Gastrointestinal:Not Present- Bloody Stool, Heartburn, Abdominal Pain, Vomiting, Nausea, Constipation, Diarrhea, Difficulty Swallowing, Jaundice and Loss of appetitie. Male Genitourinary:Present- Urinating at Night. Not Present- Urinary frequency, Blood in Urine, Weak urinary stream, Discharge, Flank Pain, Incontinence, Painful Urination, Urgency and Urinary Retention. Musculoskeletal:Present- Joint Pain and Morning Stiffness. Not Present- Muscle Weakness, Muscle Pain, Joint Swelling, Back Pain and Spasms. Neurological:Not Present- Tremor, Dizziness, Blackout spells, Paralysis, Difficulty with balance and Weakness. Psychiatric:Not Present- Insomnia.   Vitals Weight: 195 lb Height: 70 in Weight was reported by patient. Height was reported by patient. Body Surface Area: 2.09 m Body Mass Index: 27.98 kg/m Pulse: 92 (Regular) Resp.: 14 (Unlabored) BP: 142/70 (Sitting, Left Arm, Standard)    Physical Exam The physical exam findings are as follows:  Note: Patient is a 77 year old male with continued knee pain.   General Mental Status - Alert, cooperative and good historian. General Appearance- pleasant. Not in acute distress. Orientation- Oriented X3. Build & Nutrition- Well nourished and Well developed.   Head and Neck Head- normocephalic, atraumatic . Neck Global Assessment- supple. no bruit auscultated on the right and no bruit auscultated on the left.   Eye Pupil- Bilateral- Regular and Round. Motion- Bilateral- EOMI. wears glasses  part of the time, mostly reading  Chest and Lung Exam Auscultation: Breath sounds:- clear at anterior chest wall and - clear at posterior chest wall. Adventitious sounds:- No Adventitious sounds.   Cardiovascular Auscultation:Rhythm- Regular rate and rhythm. Heart Sounds- S1 WNL and S2 WNL. Murmurs & Other Heart Sounds:Auscultation of the heart reveals - No Murmurs.   Abdomen Inspection:Contour- Generalized moderate distention. Palpation/Percussion:Tenderness- Abdomen is non-tender to palpation. Rigidity (guarding)- Abdomen is soft. Auscultation:Auscultation of the abdomen reveals - Bowel sounds normal.   Male Genitourinary Not done, not pertinent to present illness  Musculoskeletal  Well developed male, alert and oriented in no apparent distress. His right knee looks great. His range of motion is 3 to 120 degrees. There is no tenderness or instability. The left knee with no effusion. Range is 5 to 120 with marked crepitus on range of motion. He has medial greater than lateral tenderness with varus deformity and no instability.  Radiographs: x-rays showing severe endstage arthritis of the left knee, bone on bone with tibial subluxation.  Assessment & Plan Osteoarthritis, Knee (715.96)  Note: Plan is for a Left Total Knee Replacement by Dr. Lequita Halt.  Plan is to go to [facility]. He did go to [facility] after his previous knee replacement.  PCP - Dr. Kirstie Peri - Patient has been seen preoperatively and felt to be stable for surgery.  Signed electronically by Roberts Gaudy, PA-C

## 2012-10-01 ENCOUNTER — Encounter (HOSPITAL_COMMUNITY): Payer: Self-pay | Admitting: *Deleted

## 2012-10-01 ENCOUNTER — Inpatient Hospital Stay (HOSPITAL_COMMUNITY)
Admission: RE | Admit: 2012-10-01 | Discharge: 2012-10-04 | DRG: 470 | Disposition: A | Discharge status: Skilled Nursing Facility | Payer: Medicare Other | Source: Ambulatory Visit | Attending: Orthopedic Surgery | Admitting: Orthopedic Surgery

## 2012-10-01 ENCOUNTER — Encounter (HOSPITAL_COMMUNITY)
Admission: RE | Disposition: A | Discharge status: Skilled Nursing Facility | Payer: Self-pay | Source: Ambulatory Visit | Attending: Orthopedic Surgery

## 2012-10-01 ENCOUNTER — Inpatient Hospital Stay (HOSPITAL_COMMUNITY): Payer: Medicare Other | Admitting: Anesthesiology

## 2012-10-01 ENCOUNTER — Encounter (HOSPITAL_COMMUNITY): Payer: Self-pay | Admitting: Anesthesiology

## 2012-10-01 DIAGNOSIS — Z96659 Presence of unspecified artificial knee joint: Secondary | ICD-10-CM

## 2012-10-01 DIAGNOSIS — R339 Retention of urine, unspecified: Secondary | ICD-10-CM | POA: Diagnosis not present

## 2012-10-01 DIAGNOSIS — M1710 Unilateral primary osteoarthritis, unspecified knee: Principal | ICD-10-CM | POA: Diagnosis present

## 2012-10-01 DIAGNOSIS — I10 Essential (primary) hypertension: Secondary | ICD-10-CM | POA: Diagnosis present

## 2012-10-01 DIAGNOSIS — E871 Hypo-osmolality and hyponatremia: Secondary | ICD-10-CM | POA: Diagnosis present

## 2012-10-01 DIAGNOSIS — E119 Type 2 diabetes mellitus without complications: Secondary | ICD-10-CM | POA: Diagnosis present

## 2012-10-01 DIAGNOSIS — E875 Hyperkalemia: Secondary | ICD-10-CM

## 2012-10-01 DIAGNOSIS — Z79899 Other long term (current) drug therapy: Secondary | ICD-10-CM

## 2012-10-01 DIAGNOSIS — D62 Acute posthemorrhagic anemia: Secondary | ICD-10-CM | POA: Diagnosis not present

## 2012-10-01 DIAGNOSIS — H547 Unspecified visual loss: Secondary | ICD-10-CM | POA: Diagnosis present

## 2012-10-01 DIAGNOSIS — Z01812 Encounter for preprocedural laboratory examination: Secondary | ICD-10-CM

## 2012-10-01 HISTORY — PX: TOTAL KNEE ARTHROPLASTY: SHX125

## 2012-10-01 LAB — TYPE AND SCREEN
ABO/RH(D): O POS
Antibody Screen: NEGATIVE

## 2012-10-01 LAB — GLUCOSE, CAPILLARY
Glucose-Capillary: 148 mg/dL — ABNORMAL HIGH (ref 70–99)
Glucose-Capillary: 161 mg/dL — ABNORMAL HIGH (ref 70–99)

## 2012-10-01 SURGERY — ARTHROPLASTY, KNEE, TOTAL
Anesthesia: General | Site: Knee | Laterality: Left | Wound class: Clean

## 2012-10-01 MED ORDER — ONDANSETRON HCL 4 MG PO TABS: 4.0000 mg | ORAL_TABLET | Freq: Four times a day (QID) | ORAL | Status: DC | PRN

## 2012-10-01 MED ORDER — FLUTICASONE PROPIONATE 50 MCG/ACT NA SUSP
2.0000 | Freq: Every day | NASAL | Status: DC
  Administered 2012-10-02 – 2012-10-03 (×2): 2 via NASAL
  Filled 2012-10-01: qty 16

## 2012-10-01 MED ORDER — FENTANYL CITRATE 0.05 MG/ML IJ SOLN
INTRAMUSCULAR | Status: DC | PRN
  Administered 2012-10-01 (×2): 50 ug via INTRAVENOUS
  Administered 2012-10-01: 25 ug via INTRAVENOUS
  Administered 2012-10-01: 75 ug via INTRAVENOUS

## 2012-10-01 MED ORDER — CISATRACURIUM BESYLATE (PF) 10 MG/5ML IV SOLN
INTRAVENOUS | Status: DC | PRN
  Administered 2012-10-01: 3 mg via INTRAVENOUS

## 2012-10-01 MED ORDER — ONDANSETRON HCL 4 MG/2ML IJ SOLN
INTRAMUSCULAR | Status: DC | PRN
  Administered 2012-10-01: 4 mg via INTRAVENOUS

## 2012-10-01 MED ORDER — BISACODYL 10 MG RE SUPP: 10.0000 mg | Freq: Every day | RECTAL | Status: DC | PRN

## 2012-10-01 MED ORDER — HYDROMORPHONE HCL PF 1 MG/ML IJ SOLN
0.2500 mg | INTRAMUSCULAR | Status: DC | PRN
  Administered 2012-10-01 (×2): 0.5 mg via INTRAVENOUS

## 2012-10-01 MED ORDER — LACTATED RINGERS IV SOLN: INTRAVENOUS | Status: DC

## 2012-10-01 MED ORDER — DEXTROSE 5 % IV SOLN
500.0000 mg | Freq: Four times a day (QID) | INTRAVENOUS | Status: DC | PRN
  Administered 2012-10-01 (×2): 500 mg via INTRAVENOUS
  Filled 2012-10-01 (×2): qty 5

## 2012-10-01 MED ORDER — CEFAZOLIN SODIUM-DEXTROSE 2-3 GM-% IV SOLR
2.0000 g | Freq: Four times a day (QID) | INTRAVENOUS | Status: AC
  Administered 2012-10-01 (×2): 2 g via INTRAVENOUS
  Filled 2012-10-01 (×2): qty 50

## 2012-10-01 MED ORDER — LIDOCAINE HCL (CARDIAC) 20 MG/ML IV SOLN
INTRAVENOUS | Status: DC | PRN
  Administered 2012-10-01: 20 mg via INTRAVENOUS

## 2012-10-01 MED ORDER — ATENOLOL 25 MG PO TABS
25.0000 mg | ORAL_TABLET | Freq: Every day | ORAL | Status: DC
  Administered 2012-10-01 – 2012-10-03 (×3): 25 mg via ORAL
  Filled 2012-10-01 (×4): qty 1

## 2012-10-01 MED ORDER — LORATADINE 10 MG PO TABS
10.0000 mg | ORAL_TABLET | Freq: Every day | ORAL | Status: DC
  Administered 2012-10-02 – 2012-10-04 (×3): 10 mg via ORAL
  Filled 2012-10-01 (×3): qty 1

## 2012-10-01 MED ORDER — METHOCARBAMOL 500 MG PO TABS
500.0000 mg | ORAL_TABLET | Freq: Four times a day (QID) | ORAL | Status: DC | PRN
  Administered 2012-10-03 (×2): 500 mg via ORAL
  Filled 2012-10-01 (×2): qty 1

## 2012-10-01 MED ORDER — TRAMADOL HCL 50 MG PO TABS
50.0000 mg | ORAL_TABLET | Freq: Four times a day (QID) | ORAL | Status: DC | PRN
  Administered 2012-10-02: 100 mg via ORAL
  Filled 2012-10-01: qty 2

## 2012-10-01 MED ORDER — METOCLOPRAMIDE HCL 10 MG PO TABS: 5.0000 mg | ORAL_TABLET | Freq: Three times a day (TID) | ORAL | Status: DC | PRN

## 2012-10-01 MED ORDER — POLYETHYLENE GLYCOL 3350 17 G PO PACK
17.0000 g | PACK | Freq: Every day | ORAL | Status: DC | PRN
  Administered 2012-10-02: 17 g via ORAL

## 2012-10-01 MED ORDER — FLEET ENEMA 7-19 GM/118ML RE ENEM: 1.0000 | ENEMA | Freq: Once | RECTAL | Status: AC | PRN

## 2012-10-01 MED ORDER — GLYCOPYRROLATE 0.2 MG/ML IJ SOLN
INTRAMUSCULAR | Status: DC | PRN
  Administered 2012-10-01: 0.4 mg via INTRAVENOUS

## 2012-10-01 MED ORDER — ACETAMINOPHEN 325 MG PO TABS: 650.0000 mg | ORAL_TABLET | Freq: Four times a day (QID) | ORAL | Status: DC | PRN

## 2012-10-01 MED ORDER — TERAZOSIN HCL 5 MG PO CAPS
5.0000 mg | ORAL_CAPSULE | Freq: Every day | ORAL | Status: DC
  Administered 2012-10-01 – 2012-10-03 (×3): 5 mg via ORAL
  Filled 2012-10-01 (×4): qty 1

## 2012-10-01 MED ORDER — PROMETHAZINE HCL 25 MG/ML IJ SOLN: 6.2500 mg | INTRAMUSCULAR | Status: DC | PRN

## 2012-10-01 MED ORDER — DIPHENHYDRAMINE HCL 12.5 MG/5ML PO ELIX: 12.5000 mg | ORAL_SOLUTION | ORAL | Status: DC | PRN

## 2012-10-01 MED ORDER — DEXAMETHASONE SODIUM PHOSPHATE 10 MG/ML IJ SOLN: 10.0000 mg | Freq: Once | INTRAMUSCULAR | Status: AC

## 2012-10-01 MED ORDER — SODIUM CHLORIDE 0.9 % IV SOLN
INTRAVENOUS | Status: DC
  Administered 2012-10-01 – 2012-10-02 (×2): via INTRAVENOUS

## 2012-10-01 MED ORDER — ACETAMINOPHEN 10 MG/ML IV SOLN: 1000.0000 mg | Freq: Once | INTRAVENOUS | Status: DC

## 2012-10-01 MED ORDER — PROPOFOL 10 MG/ML IV EMUL
INTRAVENOUS | Status: DC | PRN
  Administered 2012-10-01: 200 mg via INTRAVENOUS
  Administered 2012-10-01: 20 mg via INTRAVENOUS

## 2012-10-01 MED ORDER — BUPIVACAINE LIPOSOME 1.3 % IJ SUSP
20.0000 mL | Freq: Once | INTRAMUSCULAR | Status: AC
  Administered 2012-10-01: 20 mL
  Filled 2012-10-01: qty 20

## 2012-10-01 MED ORDER — CHLORHEXIDINE GLUCONATE 4 % EX LIQD
60.0000 mL | Freq: Once | CUTANEOUS | Status: DC
  Filled 2012-10-01: qty 60

## 2012-10-01 MED ORDER — ONDANSETRON HCL 4 MG/2ML IJ SOLN: 4.0000 mg | Freq: Four times a day (QID) | INTRAMUSCULAR | Status: DC | PRN

## 2012-10-01 MED ORDER — AMLODIPINE BESYLATE 5 MG PO TABS
5.0000 mg | ORAL_TABLET | Freq: Every day | ORAL | Status: DC
  Administered 2012-10-01 – 2012-10-03 (×3): 5 mg via ORAL
  Filled 2012-10-01 (×4): qty 1

## 2012-10-01 MED ORDER — MENTHOL 3 MG MT LOZG
1.0000 | LOZENGE | OROMUCOSAL | Status: DC | PRN
  Filled 2012-10-01: qty 9

## 2012-10-01 MED ORDER — METOCLOPRAMIDE HCL 5 MG/ML IJ SOLN: 5.0000 mg | Freq: Three times a day (TID) | INTRAMUSCULAR | Status: DC | PRN

## 2012-10-01 MED ORDER — NEOSTIGMINE METHYLSULFATE 1 MG/ML IJ SOLN
INTRAMUSCULAR | Status: DC | PRN
  Administered 2012-10-01: 3 mg via INTRAVENOUS

## 2012-10-01 MED ORDER — LACTATED RINGERS IV SOLN
INTRAVENOUS | Status: DC | PRN
  Administered 2012-10-01: 09:00:00 via INTRAVENOUS

## 2012-10-01 MED ORDER — ACETAMINOPHEN 10 MG/ML IV SOLN
INTRAVENOUS | Status: DC | PRN
  Administered 2012-10-01: 1000 mg via INTRAVENOUS

## 2012-10-01 MED ORDER — SUCCINYLCHOLINE CHLORIDE 20 MG/ML IJ SOLN
INTRAMUSCULAR | Status: DC | PRN
  Administered 2012-10-01: 160 mg via INTRAVENOUS

## 2012-10-01 MED ORDER — DEXAMETHASONE 6 MG PO TABS
10.0000 mg | ORAL_TABLET | Freq: Once | ORAL | Status: AC
  Administered 2012-10-02: 10 mg via ORAL
  Filled 2012-10-01: qty 1

## 2012-10-01 MED ORDER — HYDROMORPHONE HCL PF 1 MG/ML IJ SOLN
INTRAMUSCULAR | Status: DC | PRN
  Administered 2012-10-01 (×4): 0.5 mg via INTRAVENOUS

## 2012-10-01 MED ORDER — CEFAZOLIN SODIUM-DEXTROSE 2-3 GM-% IV SOLR
2.0000 g | INTRAVENOUS | Status: AC
  Administered 2012-10-01: 2 g via INTRAVENOUS

## 2012-10-01 MED ORDER — PHENOL 1.4 % MT LIQD: 1.0000 | OROMUCOSAL | Status: DC | PRN

## 2012-10-01 MED ORDER — OXYCODONE HCL 5 MG PO TABS
5.0000 mg | ORAL_TABLET | ORAL | Status: DC | PRN
  Administered 2012-10-01: 10 mg via ORAL
  Administered 2012-10-01 (×3): 5 mg via ORAL
  Administered 2012-10-02 – 2012-10-04 (×11): 10 mg via ORAL
  Filled 2012-10-01: qty 2
  Filled 2012-10-01: qty 1
  Filled 2012-10-01: qty 2
  Filled 2012-10-01: qty 1
  Filled 2012-10-01 (×10): qty 2
  Filled 2012-10-01: qty 1

## 2012-10-01 MED ORDER — ACETAMINOPHEN 650 MG RE SUPP: 650.0000 mg | Freq: Four times a day (QID) | RECTAL | Status: DC | PRN

## 2012-10-01 MED ORDER — 0.9 % SODIUM CHLORIDE (POUR BTL) OPTIME
TOPICAL | Status: DC | PRN
  Administered 2012-10-01: 1000 mL

## 2012-10-01 MED ORDER — SODIUM CHLORIDE 0.9 % IJ SOLN
INTRAMUSCULAR | Status: DC | PRN
  Administered 2012-10-01: 50 mL

## 2012-10-01 MED ORDER — ACETAMINOPHEN 10 MG/ML IV SOLN
1000.0000 mg | Freq: Four times a day (QID) | INTRAVENOUS | Status: AC
  Administered 2012-10-01 – 2012-10-02 (×4): 1000 mg via INTRAVENOUS
  Filled 2012-10-01 (×6): qty 100

## 2012-10-01 MED ORDER — INSULIN ASPART 100 UNIT/ML ~~LOC~~ SOLN
0.0000 [IU] | Freq: Three times a day (TID) | SUBCUTANEOUS | Status: DC
  Administered 2012-10-01 – 2012-10-02 (×2): 5 [IU] via SUBCUTANEOUS
  Administered 2012-10-02: 3 [IU] via SUBCUTANEOUS
  Administered 2012-10-03: 2 [IU] via SUBCUTANEOUS

## 2012-10-01 MED ORDER — MORPHINE SULFATE 2 MG/ML IJ SOLN: 1.0000 mg | INTRAMUSCULAR | Status: DC | PRN

## 2012-10-01 MED ORDER — SODIUM CHLORIDE 0.9 % IR SOLN
Status: DC | PRN
  Administered 2012-10-01: 1000 mL

## 2012-10-01 MED ORDER — SODIUM CHLORIDE 0.9 % IV SOLN: INTRAVENOUS | Status: DC

## 2012-10-01 MED ORDER — DOCUSATE SODIUM 100 MG PO CAPS
100.0000 mg | ORAL_CAPSULE | Freq: Two times a day (BID) | ORAL | Status: DC
  Administered 2012-10-01 – 2012-10-04 (×6): 100 mg via ORAL

## 2012-10-01 MED ORDER — RIVAROXABAN 10 MG PO TABS
10.0000 mg | ORAL_TABLET | Freq: Every day | ORAL | Status: DC
  Administered 2012-10-02 – 2012-10-04 (×3): 10 mg via ORAL
  Filled 2012-10-01 (×4): qty 1

## 2012-10-01 MED ORDER — EPHEDRINE SULFATE 50 MG/ML IJ SOLN
INTRAMUSCULAR | Status: DC | PRN
  Administered 2012-10-01: 10 mg via INTRAVENOUS

## 2012-10-01 MED ORDER — BUPIVACAINE ON-Q PAIN PUMP (FOR ORDER SET NO CHG)
INJECTION | Status: DC
  Filled 2012-10-01: qty 1

## 2012-10-01 SURGICAL SUPPLY — 52 items
BAG ZIPLOCK 12X15 (MISCELLANEOUS) ×2 IMPLANT
BANDAGE ELASTIC 6 VELCRO ST LF (GAUZE/BANDAGES/DRESSINGS) ×2 IMPLANT
BANDAGE ESMARK 6X9 LF (GAUZE/BANDAGES/DRESSINGS) ×1 IMPLANT
BLADE SAG 18X100X1.27 (BLADE) ×2 IMPLANT
BLADE SAW SGTL 11.0X1.19X90.0M (BLADE) ×2 IMPLANT
BNDG ESMARK 6X9 LF (GAUZE/BANDAGES/DRESSINGS) ×2
BOWL SMART MIX CTS (DISPOSABLE) ×2 IMPLANT
CATH KIT ON-Q SILVERSOAK 5IN (CATHETERS) IMPLANT
CEMENT HV SMART SET (Cement) ×4 IMPLANT
CLOTH BEACON ORANGE TIMEOUT ST (SAFETY) ×2 IMPLANT
CUFF TOURN SGL QUICK 34 (TOURNIQUET CUFF) ×1
CUFF TRNQT CYL 34X4X40X1 (TOURNIQUET CUFF) ×1 IMPLANT
DRAPE EXTREMITY T 121X128X90 (DRAPE) ×2 IMPLANT
DRAPE POUCH INSTRU U-SHP 10X18 (DRAPES) ×2 IMPLANT
DRAPE U-SHAPE 47X51 STRL (DRAPES) ×2 IMPLANT
DRSG ADAPTIC 3X8 NADH LF (GAUZE/BANDAGES/DRESSINGS) ×2 IMPLANT
DRSG PAD ABDOMINAL 8X10 ST (GAUZE/BANDAGES/DRESSINGS) ×2 IMPLANT
DURAPREP 26ML APPLICATOR (WOUND CARE) ×2 IMPLANT
ELECT REM PT RETURN 9FT ADLT (ELECTROSURGICAL) ×2
ELECTRODE REM PT RTRN 9FT ADLT (ELECTROSURGICAL) ×1 IMPLANT
EVACUATOR 1/8 PVC DRAIN (DRAIN) ×2 IMPLANT
FACESHIELD LNG OPTICON STERILE (SAFETY) ×10 IMPLANT
GLOVE BIO SURGEON STRL SZ8 (GLOVE) ×2 IMPLANT
GLOVE BIOGEL PI IND STRL 8 (GLOVE) ×2 IMPLANT
GLOVE BIOGEL PI INDICATOR 8 (GLOVE) ×2
GLOVE ECLIPSE 8.0 STRL XLNG CF (GLOVE) ×2 IMPLANT
GLOVE SURG SS PI 6.5 STRL IVOR (GLOVE) ×4 IMPLANT
GOWN STRL NON-REIN LRG LVL3 (GOWN DISPOSABLE) ×4 IMPLANT
GOWN STRL REIN XL XLG (GOWN DISPOSABLE) ×2 IMPLANT
HANDPIECE INTERPULSE COAX TIP (DISPOSABLE) ×1
IMMOBILIZER KNEE 20 (SOFTGOODS) ×2
IMMOBILIZER KNEE 20 THIGH 36 (SOFTGOODS) ×1 IMPLANT
KIT BASIN OR (CUSTOM PROCEDURE TRAY) ×2 IMPLANT
MANIFOLD NEPTUNE II (INSTRUMENTS) ×2 IMPLANT
NS IRRIG 1000ML POUR BTL (IV SOLUTION) ×2 IMPLANT
PACK TOTAL JOINT (CUSTOM PROCEDURE TRAY) ×2 IMPLANT
PAD ABD 7.5X8 STRL (GAUZE/BANDAGES/DRESSINGS) ×2 IMPLANT
PADDING CAST COTTON 6X4 STRL (CAST SUPPLIES) ×2 IMPLANT
POSITIONER SURGICAL ARM (MISCELLANEOUS) ×2 IMPLANT
SET HNDPC FAN SPRY TIP SCT (DISPOSABLE) ×1 IMPLANT
SPONGE GAUZE 4X4 12PLY (GAUZE/BANDAGES/DRESSINGS) ×2 IMPLANT
STRIP CLOSURE SKIN 1/2X4 (GAUZE/BANDAGES/DRESSINGS) ×2 IMPLANT
SUCTION FRAZIER 12FR DISP (SUCTIONS) ×2 IMPLANT
SUT MNCRL AB 4-0 PS2 18 (SUTURE) ×2 IMPLANT
SUT VIC AB 2-0 CT1 27 (SUTURE) ×3
SUT VIC AB 2-0 CT1 TAPERPNT 27 (SUTURE) ×3 IMPLANT
SUT VLOC 180 0 24IN GS25 (SUTURE) ×2 IMPLANT
SYR 50ML LL SCALE MARK (SYRINGE) ×2 IMPLANT
TOWEL OR 17X26 10 PK STRL BLUE (TOWEL DISPOSABLE) ×4 IMPLANT
TRAY FOLEY CATH 14FRSI W/METER (CATHETERS) ×2 IMPLANT
WATER STERILE IRR 1500ML POUR (IV SOLUTION) ×4 IMPLANT
WRAP KNEE MAXI GEL POST OP (GAUZE/BANDAGES/DRESSINGS) ×2 IMPLANT

## 2012-10-01 NOTE — Plan of Care (Signed)
Problem: Consults Goal: Diagnosis- Total Joint Replacement Primary Total Knee     

## 2012-10-01 NOTE — H&P (View-Only) (Signed)
Derrick Figueroa  DOB: 02/26/1936 Single / Language: English / Race: White Male  Date of Admission:  10/01/2012  Chief complaint:  Left Knee Pain  History of Present Illness The patient is a 77 year old male who comes in for a preoperative History and Physical. The patient is scheduled for a left total knee arthroplasty to be performed by Dr. Frank V. [name], MD at [facility] on 10/01/2012. The patient is a 77 year old male who is post-operative out from right total knee arthroplasty. The patient states that he is doing well at this time. The pain is under excellent control at this time and describe their pain as mild. He is doing well with regards to the right knee. He said the left knee is what is bothering him the most now and he wants to get it fixed. 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 This is the only thing holding him back now. It continues to interfere with his walking and regular activities. He has done well with the right knee and is looking forward to getting the left knee fixed. He is now ready to proceed with surgery. They have been treated conservatively in the past for the above stated problem and despite conservative measures, they continue to have progressive pain and severe functional limitations and dysfunction. They have failed non-operative management including home exercise, medications, and injections. It is felt that they would benefit from undergoing total joint replacement. Risks and benefits of the procedure have been discussed with the patient and they elect to proceed with surgery. There are no active contraindications to surgery such as ongoing infection or rapidly progressive neurological disease.  Problem List Osteoarthritis, Knee (715.96) S/P Right total knee arthroplasty (V43.65)   Allergies SULFA. 06/30/2010 BEE STINGS. 06/30/2010   Family History Cancer. mother and father Hypertension. mother Father.  Deceased, Lung Cancer. age 65 Mother. Deceased, Cancer. age 82   Social History Exercise. Exercises daily; does running / walking Exercises rarely; does running / walking Children. 2 Drug/Alcohol Rehab (Previously). no Drug/Alcohol Rehab (Currently). no Illicit drug use. no Living situation. live alone Current work status. working part time retired Marital status. single Tobacco use. former smoker; smoke(d) 1 1/2 pack(s) per day; uses less than half 1/2 can(s) smokeless per week Number of flights of stairs before winded. 1 2-3 Pain Contract. no Tobacco / smoke exposure. no Alcohol use. current drinker; drinks beer; 15 or more per week current drinker; drinks beer; 5 to 7 per day Pateint states that he has stopped drinking on several occassions and did not have any problems in the past and has never had any withdrawal symptoms before. Post-Surgical Plans. Plan to look into [facility] [place] after the hospital stay. He went there following the previous knee surgery.   Medication History Lisinopril (20MG Tablet, Oral) Active. AmLODIPine Besylate (5MG Tablet, Oral) Active. Atenolol (25MG Tablet, Oral) Active. Terazosin HCl (5MG Capsule, Oral) Active. Loratadine (10MG Tablet, Oral) Active. Fluticasone Propionate ( Nasal) Specific dose unknown - Active. Betamethasone Dipropionate Aug ( External) Specific dose unknown - Active. Flomax (0.4MG Capsule, Oral) Active.   Past Surgical History Prostate Biopsy. 3 times (all benign) Total Knee Replacement - Right. Date: 03/21/2012.   Medical History Diabetes Mellitus, Type II High blood pressure Impaired Hearing. Left hearing aide Prostate Disease. BPH Diet-Controlled Diabetes Mellitus Impaired Vision Hemorrhoids   Review of Systems(DREW L PERKINS, PA-C; 09/11/2012 10:29 AM) General:Not Present- Chills, Fever, Night Sweats, Fatigue, Weight Gain, Weight Loss   and Memory Loss. Skin:Not Present-  Hives, Itching, Rash, Eczema and Lesions. HEENT:Present- Hearing Loss. Not Present- Tinnitus, Headache, Double Vision, Visual Loss and Dentures. Respiratory:Not Present- Shortness of breath with exertion, Shortness of breath at rest, Allergies, Coughing up blood and Chronic Cough. Cardiovascular:Not Present- Chest Pain, Racing/skipping heartbeats, Difficulty Breathing Lying Down, Murmur, Swelling and Palpitations. Gastrointestinal:Not Present- Bloody Stool, Heartburn, Abdominal Pain, Vomiting, Nausea, Constipation, Diarrhea, Difficulty Swallowing, Jaundice and Loss of appetitie. Male Genitourinary:Present- Urinating at Night. Not Present- Urinary frequency, Blood in Urine, Weak urinary stream, Discharge, Flank Pain, Incontinence, Painful Urination, Urgency and Urinary Retention. Musculoskeletal:Present- Joint Pain and Morning Stiffness. Not Present- Muscle Weakness, Muscle Pain, Joint Swelling, Back Pain and Spasms. Neurological:Not Present- Tremor, Dizziness, Blackout spells, Paralysis, Difficulty with balance and Weakness. Psychiatric:Not Present- Insomnia.   Vitals Weight: 195 lb Height: 70 in Weight was reported by patient. Height was reported by patient. Body Surface Area: 2.09 m Body Mass Index: 27.98 kg/m Pulse: 92 (Regular) Resp.: 14 (Unlabored) BP: 142/70 (Sitting, Left Arm, Standard)    Physical Exam The physical exam findings are as follows:  Note: Patient is a 77 year old male with continued knee pain.   General Mental Status - Alert, cooperative and good historian. General Appearance- pleasant. Not in acute distress. Orientation- Oriented X3. Build & Nutrition- Well nourished and Well developed.   Head and Neck Head- normocephalic, atraumatic . Neck Global Assessment- supple. no bruit auscultated on the right and no bruit auscultated on the left.   Eye Pupil- Bilateral- Regular and Round. Motion- Bilateral- EOMI. wears glasses  part of the time, mostly reading  Chest and Lung Exam Auscultation: Breath sounds:- clear at anterior chest wall and - clear at posterior chest wall. Adventitious sounds:- No Adventitious sounds.   Cardiovascular Auscultation:Rhythm- Regular rate and rhythm. Heart Sounds- S1 WNL and S2 WNL. Murmurs & Other Heart Sounds:Auscultation of the heart reveals - No Murmurs.   Abdomen Inspection:Contour- Generalized moderate distention. Palpation/Percussion:Tenderness- Abdomen is non-tender to palpation. Rigidity (guarding)- Abdomen is soft. Auscultation:Auscultation of the abdomen reveals - Bowel sounds normal.   Male Genitourinary Not done, not pertinent to present illness  Musculoskeletal  Well developed male, alert and oriented in no apparent distress. His right knee looks great. His range of motion is 3 to 120 degrees. There is no tenderness or instability. The left knee with no effusion. Range is 5 to 120 with marked crepitus on range of motion. He has medial greater than lateral tenderness with varus deformity and no instability.  Radiographs: x-rays showing severe endstage arthritis of the left knee, bone on bone with tibial subluxation.  Assessment & Plan Osteoarthritis, Knee (715.96)  Note: Plan is for a Left Total Knee Replacement by Dr. [name].  Plan is to go to [facility] [place]. He did go to [facility] after his previous knee replacement.  PCP - Dr. Ashish Shah - Patient has been seen preoperatively and felt to be stable for surgery.  Signed electronically by DREW L PERKINS, PA-C  

## 2012-10-01 NOTE — Interval H&P Note (Signed)
History and Physical Interval Note:  10/01/2012 8:22 AM  Derrick Figueroa  has presented today for surgery, with the diagnosis of Osteoarthritis of the Left Knee  The various methods of treatment have been discussed with the patient and family. After consideration of risks, benefits and other options for treatment, the patient has consented to  Procedure(s) (LRB) with comments: TOTAL KNEE ARTHROPLASTY (Left) as a surgical intervention .  The patient's history has been reviewed, patient examined, no change in status, stable for surgery.  I have reviewed the patient's chart and labs.  Questions were answered to the patient's satisfaction.     Loanne Drilling

## 2012-10-01 NOTE — Anesthesia Preprocedure Evaluation (Signed)
Anesthesia Evaluation  Patient identified by MRN, date of birth, ID band Patient awake    Reviewed: Allergy & Precautions, H&P , NPO status , Patient's Chart, lab work & pertinent test results, reviewed documented beta blocker date and time   Airway Mallampati: II TM Distance: >3 FB Neck ROM: Full    Dental  (+) Teeth Intact, Dental Advisory Given and Poor Dentition   Pulmonary sleep apnea ,  breath sounds clear to auscultation        Cardiovascular hypertension, Pt. on medications Rhythm:Regular Rate:Normal  Denies cardiac symptoms   Neuro/Psych negative neurological ROS  negative psych ROS   GI/Hepatic negative GI ROS, (+)     substance abuse (drinks 6 pak of beer per day)  alcohol use,   Endo/Other  diabetesDiet controlled  Renal/GU negative Renal ROS  negative genitourinary   Musculoskeletal   Abdominal   Peds  Hematology negative hematology ROS (+)   Anesthesia Other Findings   Reproductive/Obstetrics negative OB ROS                           Anesthesia Physical Anesthesia Plan  ASA: III  Anesthesia Plan: General   Post-op Pain Management:    Induction: Intravenous  Airway Management Planned: Oral ETT  Additional Equipment:   Intra-op Plan:   Post-operative Plan: Extubation in OR  Informed Consent: I have reviewed the patients History and Physical, chart, labs and discussed the procedure including the risks, benefits and alternatives for the proposed anesthesia with the patient or authorized representative who has indicated his/her understanding and acceptance.   Dental advisory given  Plan Discussed with: CRNA  Anesthesia Plan Comments:         Anesthesia Quick Evaluation

## 2012-10-01 NOTE — Anesthesia Postprocedure Evaluation (Signed)
Anesthesia Post Note  Patient: Derrick Figueroa  Procedure(s) Performed: Procedure(s) (LRB): TOTAL KNEE ARTHROPLASTY (Left)  Anesthesia type: General  Patient location: PACU  Post pain: Pain level controlled  Post assessment: Post-op Vital signs reviewed  Last Vitals:  Filed Vitals:   10/01/12 1130  BP: 135/57  Pulse: 90  Temp: 36.4 C  Resp: 15    Post vital signs: Reviewed  Level of consciousness: sedated  Complications: No apparent anesthesia complications

## 2012-10-01 NOTE — Evaluation (Signed)
Physical Therapy Evaluation Patient Details Name: Derrick Figueroa MRN: [id number] DOB: 1935/09/19 Today's Date: 10/01/2012 Time: 7829-5621 PT Time Calculation (min): 24 min  PT Assessment / Plan / Recommendation Clinical Impression  pt s/p left TKA POD#0 today adn will benefit from PT tomaximize independence in preparation  for SNF    PT Assessment  Patient needs continued PT services    Follow Up Recommendations  SNF (pt prefers [facility])    Does the patient have the potential to tolerate intense rehabilitation      Barriers to Discharge        Equipment Recommendations  Rolling walker with 5" wheels    Recommendations for Other Services     Frequency 7X/week    Precautions / Restrictions Precautions Precautions: Knee Required Braces or Orthoses: Knee Immobilizer - Left Restrictions Weight Bearing Restrictions: No LLE Weight Bearing: Weight bearing as tolerated   Pertinent Vitals/Pain VSS; decreased from 4 to 2L O2 after PT per RN advice sats 96% on RA after activity      Mobility  Bed Mobility Bed Mobility: Supine to Sit Supine to Sit: 4: Min assist Details for Bed Mobility Assistance: cues for technique, self assist Transfers Transfers: Sit to Stand;Stand to Sit Sit to Stand: 3: Mod assist;From bed Stand to Sit: 4: Min assist;To chair/3-in-1 Details for Transfer Assistance: cues for hand placement and wt shift Ambulation/Gait Ambulation/Gait Assistance: 4: Min assist Ambulation Distance (Feet): 24 Feet Assistive device: Rolling walker Ambulation/Gait Assistance Details: cues for sequence, RW safety Gait Pattern: Step-to pattern;Antalgic    Shoulder Instructions     Exercises Total Joint Exercises Ankle Circles/Pumps: AROM;10 reps;Both Quad Sets: AROM;Both;10 reps   PT Diagnosis: Difficulty walking  PT Problem List: Decreased strength;Decreased range of motion;Decreased activity tolerance;Decreased balance;Decreased mobility;Decreased knowledge of use  of DME;Decreased safety awareness PT Treatment Interventions: DME instruction;Gait training;Functional mobility training;Therapeutic activities;Therapeutic exercise;Balance training;Patient/family education   PT Goals Acute Rehab PT Goals PT Goal Formulation: With patient Time For Goal Achievement: 10/08/12 Potential to Achieve Goals: Good Pt will go Supine/Side to Sit: with supervision PT Goal: Supine/Side to Sit - Progress: Goal set today Pt will go Sit to Supine/Side: with supervision PT Goal: Sit to Supine/Side - Progress: Goal set today Pt will go Sit to Stand: with supervision PT Goal: Sit to Stand - Progress: Goal set today Pt will go Stand to Sit: with supervision PT Goal: Stand to Sit - Progress: Goal set today Pt will Ambulate: with supervision;51 - 150 feet;with rolling walker PT Goal: Ambulate - Progress: Goal set today Pt will Perform Home Exercise Program: with supervision, verbal cues required/provided PT Goal: Perform Home Exercise Program - Progress: Goal set today  Visit Information  Last PT Received On: 10/01/12 Assistance Needed: +1    Subjective Data  Subjective: i have been here before Patient Stated Goal: rehab   Prior Functioning  Home Living Lives With: Alone Available Help at Discharge: Skilled Nursing Facility Home Adaptive Equipment: Straight cane Additional Comments: plans to go to [facility] Prior Function Level of Independence: Independent Communication Communication: No difficulties    Cognition  Overall Cognitive Status: Appears within functional limits for tasks assessed/performed Arousal/Alertness: Awake/alert Orientation Level: Appears intact for tasks assessed Behavior During Session: [facility] for tasks performed    Extremity/Trunk Assessment Right Lower Extremity Assessment RLE ROM/Strength/Tone: [facility] for tasks assessed Left Lower Extremity Assessment LLE ROM/Strength/Tone: Deficits;Due to pain LLE ROM/Strength/Tone Deficits: ankle WFL;  able to assist with SLR   Balance    End of Session PT -  End of Session Equipment Utilized During Treatment: Gait belt;Left lower extremity prosthesis Activity Tolerance: Patient tolerated treatment well Patient left: in chair;with call bell/phone within reach;with nursing in room Nurse Communication: Mobility status CPM Left Knee CPM Left Knee: On  GP     [facility] 10/01/2012, 3:34 PM

## 2012-10-01 NOTE — Op Note (Signed)
Pre-operative diagnosis- Osteoarthritis  Left knee(s)  Post-operative diagnosis- Osteoarthritis Left knee(s)  Procedure-  Left  Total Knee Arthroplasty  Surgeon- Gus Rankin. Rosena Bartle, MD  Assistant- Dimitri Ped, PA-C   Anesthesia-  General EBL-* No blood loss amount entered *  Drains Hemovac  Tourniquet time-  Total Tourniquet Time Documented: Thigh (Left) - 36 minutes   Complications- None  Condition-PACU - hemodynamically stable.   Brief Clinical Note   Derrick Figueroa is a 77 y.o. year old male with end stage OA of his left knee with progressively worsening pain and dysfunction. He has constant pain, with activity and at rest and significant functional deficits with difficulties even with ADLs. He has had extensive non-op management including analgesics, injections of cortisone and viscosupplements, and home exercise program, but remains in significant pain with significant dysfunction. Radiographs show bone on bone arthritis medial and patellofemoral. He presents now for left Total Knee Arthroplasty.     Procedure in detail---   The patient is brought into the operating room and positioned supine on the operating table. After successful administration of  General,   a tourniquet is placed high on the  Left thigh(s) and the lower extremity is prepped and draped in the usual sterile fashion. Time out is performed by the operating team and then the  Left lower extremity is wrapped in Esmarch, knee flexed and the tourniquet inflated to 300 mmHg.       A midline incision is made with a ten blade through the subcutaneous tissue to the level of the extensor mechanism. A fresh blade is used to make a medial parapatellar arthrotomy. Soft tissue over the proximal medial tibia is subperiosteally elevated to the joint line with a knife and into the semimembranosus bursa with a Cobb elevator. Soft tissue over the proximal lateral tibia is elevated with attention being paid to avoiding the patellar  tendon on the tibial tubercle. The patella is everted, knee flexed 90 degrees and the ACL and PCL are removed. Findings are bone on bone medial and patellofemoral with large medial osteophytes.        The drill is used to create a starting hole in the distal femur and the canal is thoroughly irrigated with sterile saline to remove the fatty contents. The 5 degree Left  valgus alignment guide is placed into the femoral canal and the distal femoral cutting block is pinned to remove 10 mm off the distal femur. Resection is made with an oscillating saw.      The tibia is subluxed forward and the menisci are removed. The extramedullary alignment guide is placed referencing proximally at the medial aspect of the tibial tubercle and distally along the second metatarsal axis and tibial crest. The block is pinned to remove 2mm off the more deficient medial  side. Resection is made with an oscillating saw. Size 4is the most appropriate size for the tibia and the proximal tibia is prepared with the modular drill and keel punch for that size.      The femoral sizing guide is placed and size 4 narrow is most appropriate. Rotation is marked off the epicondylar axis and confirmed by creating a rectangular flexion gap at 90 degrees. The size 4 cutting block is pinned in this rotation and the anterior, posterior and chamfer cuts are made with the oscillating saw. The intercondylar block is then placed and that cut is made.      Trial size 4 tibial component, trial size 4 narrow posterior stabilized femur and  a 12.5  mm posterior stabilized rotating platform insert trial is placed. Full extension is achieved with excellent varus/valgus and anterior/posterior balance throughout full range of motion. The patella is everted and thickness measured to be 24  mm. Free hand resection is taken to 14 mm, a 38 template is placed, lug holes are drilled, trial patella is placed, and it tracks normally. Osteophytes are removed off the  posterior femur with the trial in place. All trials are removed and the cut bone surfaces prepared with pulsatile lavage. Cement is mixed and once ready for implantation, the size 4 tibial implant, size  4 narrow posterior stabilized femoral component, and the size 38 patella are cemented in place and the patella is held with the clamp. The trial insert is placed and the knee held in full extension. The Exparel (20 ml mixed with 50 ml saline) is injected into the extensor mechanism, posterior capsule, medial and lateral gutters and subcutaneous tissues.  All extruded cement is removed and once the cement is hard the permanent 12.5 mm posterior stabilized rotating platform insert is placed into the tibial tray.      The wound is copiously irrigated with saline solution and the extensor mechanism closed over a hemovac drain with #1 PDS suture. The tourniquet is released for a total tourniquet time of 35  minutes. Flexion against gravity is 140 degrees and the patella tracks normally. Subcutaneous tissue is closed with 2.0 vicryl and subcuticular with running 4.0 Monocryl. The incision is cleaned and dried and steri-strips and a bulky sterile dressing are applied. The limb is placed into a knee immobilizer and the patient is awakened and transported to recovery in stable condition.      Please note that a surgical assistant was a medical necessity for this procedure in order to perform it in a safe and expeditious manner. Surgical assistant was necessary to retract the ligaments and vital neurovascular structures to prevent injury to them and also necessary for proper positioning of the limb to allow for anatomic placement of the prosthesis.   Gus Rankin Juliann Olesky, MD    10/01/2012, 10:20 AM

## 2012-10-01 NOTE — Transfer of Care (Signed)
Immediate Anesthesia Transfer of Care Note  Patient: Derrick Figueroa  Procedure(s) Performed: Procedure(s) (LRB) with comments: TOTAL KNEE ARTHROPLASTY (Left)  Patient Location: PACU  Anesthesia Type:General  Level of Consciousness: awake, alert , oriented and patient cooperative  Airway & Oxygen Therapy: Patient Spontanous Breathing and Patient connected to face mask oxygen  Post-op Assessment: Report given to PACU RN, Post -op Vital signs reviewed and stable and Patient moving all extremities  Post vital signs: Reviewed and stable  Complications: No apparent anesthesia complications

## 2012-10-01 NOTE — Progress Notes (Signed)
Utilization review completed.  

## 2012-10-02 LAB — GLUCOSE, CAPILLARY
Glucose-Capillary: 116 mg/dL — ABNORMAL HIGH (ref 70–99)
Glucose-Capillary: 153 mg/dL — ABNORMAL HIGH (ref 70–99)
Glucose-Capillary: 226 mg/dL — ABNORMAL HIGH (ref 70–99)
Glucose-Capillary: 176 mg/dL — ABNORMAL HIGH (ref 70–99)

## 2012-10-02 LAB — BASIC METABOLIC PANEL WITH GFR
BUN: 10 mg/dL (ref 6–23)
CO2: 26 meq/L (ref 19–32)
Calcium: 8.2 mg/dL — ABNORMAL LOW (ref 8.4–10.5)
Chloride: 92 meq/L — ABNORMAL LOW (ref 96–112)
Creatinine, Ser: 0.9 mg/dL (ref 0.50–1.35)
GFR calc Af Amer: >90 mL/min
GFR calc non Af Amer: 82 mL/min — ABNORMAL LOW
Glucose, Bld: 109 mg/dL — ABNORMAL HIGH (ref 70–99)
Potassium: 3.9 meq/L (ref 3.5–5.1)
Sodium: 126 meq/L — ABNORMAL LOW (ref 135–145)

## 2012-10-02 LAB — CBC
Hemoglobin: 10.1 g/dL — ABNORMAL LOW (ref 13.0–17.0)
MCH: 32.9 pg (ref 26.0–34.0)
MCV: 93.8 fL (ref 78.0–100.0)
RBC: 3.07 MIL/uL — ABNORMAL LOW (ref 4.22–5.81)
WBC: 5.9 10*3/uL (ref 4.0–10.5)

## 2012-10-02 MED ORDER — SODIUM CHLORIDE 0.9 % IV SOLN
INTRAVENOUS | Status: DC
  Administered 2012-10-02 – 2012-10-03 (×2): via INTRAVENOUS

## 2012-10-02 MED ORDER — SODIUM CHLORIDE 0.9 % IV BOLUS (SEPSIS)
500.0000 mL | Freq: Once | INTRAVENOUS | Status: AC
  Administered 2012-10-02: 11:00:00 via INTRAVENOUS
  Administered 2012-10-02: 500 mL via INTRAVENOUS

## 2012-10-02 NOTE — Progress Notes (Signed)
   Subjective: 1 Day Post-Op Procedure(s) (LRB): TOTAL KNEE ARTHROPLASTY (Left) Patient reports pain as mild.   Patient seen in rounds with Dr. Lequita Halt. Patient is well, and has had no acute complaints or problems We will start therapy today.  Plan is to go Skilled nursing facility after hospital stay.  Objective: Vital signs in last 24 hours: Temp:  [97.3 F (36.3 C)-98.7 F (37.1 C)] 97.7 F (36.5 C) (01/21 0532) Pulse Rate:  [59-92] 70  (01/21 0532) Resp:  [10-16] 14  (01/21 0532) BP: (92-150)/(44-76) 92/55 mmHg (01/21 0532) SpO2:  [85 %-100 %] 100 % (01/21 0532) Weight:  [91.627 kg (202 lb)] 91.627 kg (202 lb) (01/20 1300)  Intake/Output from previous day:  Intake/Output Summary (Last 24 hours) at 10/02/12 0804 Last data filed at 10/02/12 0600  Gross per 24 hour  Intake   4055 ml  Output   2670 ml  Net   1385 ml    Intake/Output this shift: UOP 800 since MN +1385  Labs:  [facility] 10/02/12 0445  HGB 10.1*    Basename 10/02/12 0445  WBC 5.9  RBC 3.07*  HCT 28.8*  PLT 162    Basename 10/02/12 0445  NA 126*  K 3.9  CL 92*  CO2 26  BUN 10  CREATININE 0.90  GLUCOSE 109*  CALCIUM 8.2*   No results found for this basename: LABPT:2,INR:2 in the last 72 hours  EXAM General - Patient is Alert, Appropriate and Oriented Extremity - Neurovascular intact Sensation intact distally Dorsiflexion/Plantar flexion intact Dressing - dressing C/D/I Motor Function - intact, moving foot and toes well on exam.  Hemovac pulled without difficulty.  Past Medical History  Diagnosis Date  . Hypertension   . Sinus complaint   . Arthritis   . Enlarged prostate   . Nocturia   . Diabetes mellitus     diet controlled  . Hearing difficulty   . Sleep apnea     STOP BANG SCORE 5    Assessment/Plan: 1 Day Post-Op Procedure(s) (LRB): TOTAL KNEE ARTHROPLASTY (Left) Active Problems:  OA (osteoarthritis) of knee  Estimated Body mass index is 28.98 kg/(m^2) as  calculated from the following:   Height as of this encounter: 5\' 10" (1.778 m).   Weight as of this encounter: 202 lb(91.627 kg). Advance diet Up with therapy Discharge to SNF - Plan is for [facility]  DVT Prophylaxis - Xarelto Weight-Bearing as tolerated to left leg No vaccines. D/C O2 and Pulse OX and try on Room Air  Nekoda Chock 10/02/2012, 8:04 AM

## 2012-10-02 NOTE — Progress Notes (Signed)
Clinical Social Work Department CLINICAL SOCIAL WORK PLACEMENT NOTE 10/02/2012  Patient:  Derrick Figueroa,Derrick Figueroa  Account Number:  [id number] Admit date:  10/01/2012  Clinical Social Worker:  Cori Razor, LCSW  Date/time:  10/02/2012 12:29 PM  Clinical Social Work is seeking post-discharge placement for this patient at the following level of care:   SKILLED NURSING   (*CSW will update this form in Epic as items are completed)     Patient/family provided with [facility] Department of Clinical Social Work's list of facilities offering this level of care within the geographic area requested by the patient (or if unable, by the patient's family).  10/02/2012  Patient/family informed of their freedom to choose among providers that offer the needed level of care, that participate in Medicare, Medicaid or managed care program needed by the patient, have an available bed and are willing to accept the patient.    Patient/family informed of [facility]' ownership interest in [facility], as well as of the fact that they are under no obligation to receive care at this facility.  PASARR submitted to EDS on 10/02/2012 PASARR number received from EDS on   FL2 transmitted to all facilities in geographic area requested by pt/family on  10/02/2012 FL2 transmitted to all facilities within larger geographic area on   Patient informed that his/her managed care company has contracts with or will negotiate with  certain facilities, including the following:     Patient/family informed of bed offers received:  10/02/2012 Patient chooses bed at [facility] Physician recommends and patient chooses bed at    Patient to be transferred to [facility] on   Patient to be transferred to facility by   The following physician request were entered in Epic:   Additional Comments:  Cori Razor LCSW [phone]

## 2012-10-02 NOTE — Plan of Care (Signed)
Problem: Consults Goal: Diagnosis- Total Joint Replacement Outcome: Completed/Met Date Met:  10/02/12 Primary Total Knee LEFT

## 2012-10-02 NOTE — Progress Notes (Signed)
10/02/12 1100  PT Visit Information  Last PT Received On 10/02/12  Assistance Needed +1  PT Time Calculation  PT Start Time 1058  PT Stop Time 1116  PT Time Calculation (min) 18 min  Precautions  Precautions Knee  Required Braces or Orthoses Knee Immobilizer - Left  Knee Immobilizer - Left Discontinue once straight leg raise with < 10 degree lag  Restrictions  LLE Weight Bearing WBAT  Transfers  Transfers Sit to Stand;Stand to Sit  Sit to Stand 4: Min assist;From chair/3-in-1;With upper extremity assist  Stand to Sit 4: Min assist;To chair/3-in-1;With upper extremity assist  Details for Transfer Assistance cues for hand placement and wt shift  Ambulation/Gait  Ambulation/Gait Assistance 4: Min guard;4: Min assist  Ambulation Distance (Feet) 80 Feet  Assistive device Rolling walker  Ambulation/Gait Assistance Details cues for sequence, breathing  Gait Pattern Step-to pattern;Step-through pattern;Antalgic  Total Joint Exercises  Ankle Circles/Pumps AROM;10 reps;Both  Quad Sets AROM;Both;10 reps  PT - End of Session  Equipment Utilized During Treatment Gait belt;Left knee immobilizer  Activity Tolerance Patient tolerated treatment well  Patient left in chair;with call bell/phone within reach;with nursing in room  Nurse Communication Patient requests pain meds  PT - Assessment/Plan  Comments on Treatment Session progressing well, in 6/10 pain, RN gave meds, planning for D/C to SNF on Thurs  PT Plan Discharge plan remains appropriate;Frequency remains appropriate  PT Frequency 7X/week  Follow Up Recommendations SNF  PT equipment Rolling walker with 5" wheels  Acute Rehab PT Goals  Time For Goal Achievement 10/08/12  Potential to Achieve Goals Good  Pt will go Sit to Stand with supervision  PT Goal: Sit to Stand - Progress Progressing toward goal  Pt will go Stand to Sit with supervision  PT Goal: Stand to Sit - Progress Progressing toward goal  Pt will Ambulate with  supervision;51 - 150 feet;with rolling walker  PT Goal: Ambulate - Progress Progressing toward goal  PT General Charges  $$ ACUTE PT VISIT 1 Procedure  PT Treatments  $Gait Training 8-22 mins

## 2012-10-02 NOTE — Progress Notes (Signed)
OT Cancellation Note  Patient Details Name: Derrick Figueroa MRN: [id number] DOB: 1936/04/17   Cancelled Treatment:    Reason Eval/Treat Not Completed: Other (comment) (Pt screened.  Pt for D/C to SNF.  Will defer OT to SNF)  Jeani Hawking M [phone] 10/02/2012, 10:22 AM

## 2012-10-02 NOTE — Progress Notes (Signed)
Order to in.out cath was present. Pt cathed and 1900 ml was removed.

## 2012-10-02 NOTE — Progress Notes (Signed)
Physical Therapy Treatment Patient Details Name: Derrick Figueroa MRN: [id number] DOB: [birth year] Today's Date: 10/02/2012 Time: 7829-5621 PT Time Calculation (min): 34 min  PT Assessment / Plan / Recommendation Comments on Treatment Session  progressing well; BP decreased this am, getting IV bolus, BP after amb was 146/70; RN aware; pain 6/10 and RN gave meds during session    Follow Up Recommendations  SNF     Does the patient have the potential to tolerate intense rehabilitation     Barriers to Discharge        Equipment Recommendations  Rolling walker with 5" wheels    Recommendations for Other Services    Frequency 7X/week   Plan Discharge plan remains appropriate;Frequency remains appropriate    Precautions / Restrictions Precautions Precautions: Knee Required Braces or Orthoses: Knee Immobilizer - Left Knee Immobilizer - Left: Discontinue once straight leg raise with < 10 degree lag Restrictions LLE Weight Bearing: Weight bearing as tolerated   Pertinent Vitals/Pain     Mobility  Transfers Transfers: Sit to Stand;Stand to Sit Sit to Stand: 4: Min assist;From chair/3-in-1;With upper extremity assist Stand to Sit: 4: Min assist;To chair/3-in-1;With upper extremity assist Details for Transfer Assistance: cues for hand placement and wt shift Ambulation/Gait Ambulation/Gait Assistance: 4: Min assist Ambulation Distance (Feet): 80 Feet Assistive device: Rolling walker Ambulation/Gait Assistance Details:   cues for sequence, RW safety  Gait Pattern: Step-to pattern;Antalgic    Exercises Total Joint Exercises Ankle Circles/Pumps: AROM;10 reps;Both Quad Sets: AROM;Both;10 reps Heel Slides: AAROM;Left;10 reps Hip ABduction/ADduction: AROM;Left;10 reps;AAROM Straight Leg Raises: AROM;AAROM;Left;10 reps   PT Diagnosis:    PT Problem List:   PT Treatment Interventions:     PT Goals Acute Rehab PT Goals Time For Goal Achievement: 10/08/12 Potential to Achieve Goals:  Good Pt will go Sit to Stand: with supervision PT Goal: Sit to Stand - Progress: Progressing toward goal Pt will go Stand to Sit: with supervision PT Goal: Stand to Sit - Progress: Progressing toward goal Pt will Ambulate: with supervision;51 - 150 feet;with rolling walker PT Goal: Ambulate - Progress: Progressing toward goal Pt will Perform Home Exercise Program: with supervision, verbal cues required/provided PT Goal: Perform Home Exercise Program - Progress: Progressing toward goal  Visit Information  Last PT Received On: 10/02/12 Assistance Needed: +1    Subjective Data  Subjective: i woke up a lot Patient Stated Goal: rehab   Cognition  Overall Cognitive Status: Appears within functional limits for tasks assessed/performed Arousal/Alertness: Awake/alert Orientation Level: Appears intact for tasks assessed Behavior During Session: [facility] for tasks performed    Balance     End of Session PT - End of Session Equipment Utilized During Treatment: Gait belt;Left knee immobilizer   GP     [facility] 10/02/2012, 9:33 AM

## 2012-10-02 NOTE — Progress Notes (Signed)
Pt c/o his bladder feeling full and tight. His F/C was removed at 1125 am. Pt voided 100 ml at 1845 and is having recurrent overflow. He does state he has some problems with his prostate and has had some difficulties voiding in the past prior to admission. Nurse bladder scanned Pt and the results were 999 ml. No order to cath, on call MD contacted and currently waiting response.

## 2012-10-02 NOTE — Progress Notes (Signed)
Clinical Social Work Department BRIEF PSYCHOSOCIAL ASSESSMENT 10/02/2012  Patient:  Gartland,Laurens     Account Number:  [id number]     Admit date:  10/01/2012  Clinical Social Worker:  Candie Chroman  Date/Time:  10/02/2012 11:55 AM  Referred by:  Physician  Date Referred:  10/02/2012 Referred for  SNF Placement   Other Referral:   Interview type:  Patient Other interview type:    PSYCHOSOCIAL DATA Living Status:  ALONE Admitted from facility:   Level of care:   Primary support name:  Marta Lamas Primary support relationship to patient:  CHILD, ADULT Degree of support available:   supportive    CURRENT CONCERNS Current Concerns  Post-Acute Placement   Other Concerns:    SOCIAL WORK ASSESSMENT / PLAN Pt is a 77 yr old gentleman living at home prior to hospitalization. CSW met with pt to assist with d/c planning. Pt has made prior arrangements to have ST SNF placement at [facility] following hospital d/c. CSW has contacted SNF and plan has been confirmed. CSW will follow to assist with d/c planning to [facility].   Assessment/plan status:  Psychosocial Support/Ongoing Assessment of Needs Other assessment/ plan:   Information/referral to community resources:   None needed at this time.    PATIENT'S/FAMILY'S RESPONSE TO PLAN OF CARE: Pt has had rehab at [facility] in the past. He is looking forward to having rehab there again.    Cori Razor LCSW [phone]

## 2012-10-02 NOTE — Care Management Note (Signed)
    Page 1 of 1   10/02/2012     3:10:35 PM   CARE MANAGEMENT NOTE 10/02/2012  Patient:  Carre,Keelin   Account Number:  [id number]  Date Initiated:  10/02/2012  Documentation initiated by:  Colleen Can  Subjective/Objective Assessment:   dx osteoarthritis left knee: total knee replacemnt     Action/Plan:   CM spoke with patient. Plans are for patient to go to SNF rehab upon discharge   Anticipated DC Date:  10/04/2012   Anticipated DC Plan:  SKILLED NURSING FACILITY  In-house referral  Clinical Social Worker      DC Planning Services  CM consult      Choice offered to / List presented to:             Status of service:  Completed, signed off Medicare Important Message given?  NA - LOS <3 / Initial given by admissions (If response is "NO", the following Medicare IM given date fields will be blank) Date Medicare IM given:   Date Additional Medicare IM given:    Discharge Disposition:    Per UR Regulation:    If discussed at Long Length of Stay Meetings, dates discussed:    Comments:

## 2012-10-03 LAB — BASIC METABOLIC PANEL
BUN: 13 mg/dL (ref 6–23)
Calcium: 8.8 mg/dL (ref 8.4–10.5)
Creatinine, Ser: 0.91 mg/dL (ref 0.50–1.35)
GFR calc Af Amer: >90 mL/min (ref >90)
GFR calc non Af Amer: 81 mL/min — ABNORMAL LOW (ref >90)
Glucose, Bld: 156 mg/dL — ABNORMAL HIGH (ref 70–99)
Potassium: 4.6 mEq/L (ref 3.5–5.1)

## 2012-10-03 LAB — GLUCOSE, CAPILLARY
Glucose-Capillary: 145 mg/dL — ABNORMAL HIGH (ref 70–99)
Glucose-Capillary: 106 mg/dL — ABNORMAL HIGH (ref 70–99)

## 2012-10-03 LAB — CBC
HCT: 27.9 % — ABNORMAL LOW (ref 39.0–52.0)
MCH: 33 pg (ref 26.0–34.0)
MCHC: 35.1 g/dL (ref 30.0–36.0)
RDW: 12.9 % (ref 11.5–15.5)

## 2012-10-03 NOTE — Progress Notes (Signed)
Physical Therapy Treatment Patient Details Name: Derrick Figueroa MRN: [id number] DOB: 1935-09-22 Today's Date: 10/03/2012 Time: 7829-5621 PT Time Calculation (min): 21 min  PT Assessment / Plan / Recommendation Comments on Treatment Session  Pt requests break after ther ex    Follow Up Recommendations  SNF     Does the patient have the potential to tolerate intense rehabilitation     Barriers to Discharge        Equipment Recommendations  Rolling walker with 5" wheels    Recommendations for Other Services    Frequency 7X/week   Plan Discharge plan remains appropriate;Frequency remains appropriate    Precautions / Restrictions Precautions Precautions: Knee Required Braces or Orthoses: Knee Immobilizer - Left Knee Immobilizer - Left: Discontinue once straight leg raise with < 10 degree lag Restrictions Weight Bearing Restrictions: No LLE Weight Bearing: Weight bearing as tolerated   Pertinent Vitals/Pain     Mobility       Exercises Total Joint Exercises Ankle Circles/Pumps: AROM;Both;20 reps Quad Sets: AROM;Both;20 reps Heel Slides: AAROM;Left;20 reps Straight Leg Raises: AROM;AAROM;Left;20 reps   PT Diagnosis:    PT Problem List:   PT Treatment Interventions:     PT Goals Acute Rehab PT Goals PT Goal Formulation: With patient Time For Goal Achievement: 10/08/12 Potential to Achieve Goals: Good  Visit Information  Last PT Received On: 10/03/12 Assistance Needed: +1    Subjective Data  Subjective: I had a hard night - they had to put the catheter back in Patient Stated Goal: rehab   Cognition  Overall Cognitive Status: Appears within functional limits for tasks assessed/performed Arousal/Alertness: Awake/alert Orientation Level: Appears intact for tasks assessed Behavior During Session: [facility] for tasks performed    Balance     End of Session PT - End of Session Activity Tolerance: Patient tolerated treatment well CPM Left Knee CPM Left Knee: Off    GP     Derrick Figueroa 10/03/2012, 11:43 AM

## 2012-10-03 NOTE — Progress Notes (Signed)
Physical Therapy Treatment Patient Details Name: Derrick Figueroa MRN: [id number] DOB: 1936-04-25 Today's Date: 10/03/2012 Time: 1478-2956 PT Time Calculation (min): 12 min  PT Assessment / Plan / Recommendation Comments on Treatment Session  Pt requests break after ther ex    Follow Up Recommendations  SNF     Does the patient have the potential to tolerate intense rehabilitation     Barriers to Discharge        Equipment Recommendations  Rolling walker with 5" wheels    Recommendations for Other Services    Frequency 7X/week   Plan Discharge plan remains appropriate;Frequency remains appropriate    Precautions / Restrictions Precautions Precautions: Knee Required Braces or Orthoses: Knee Immobilizer - Left Knee Immobilizer - Left: Discontinue once straight leg raise with < 10 degree lag Restrictions Weight Bearing Restrictions: No LLE Weight Bearing: Weight bearing as tolerated   Pertinent Vitals/Pain 4/10; premedicated    Mobility  Transfers Transfers: Sit to Stand;Stand to Sit Sit to Stand: 4: Min assist;From chair/3-in-1;With upper extremity assist Stand to Sit: 4: Min assist;To chair/3-in-1;With upper extremity assist Details for Transfer Assistance: cues for hand placement and wt shift Ambulation/Gait Ambulation/Gait Assistance: 4: Min guard;4: Min assist Ambulation Distance (Feet): 100 Feet (twice) Assistive device: Rolling walker Ambulation/Gait Assistance Details: min cues for posture, stride length and position from RW Gait Pattern: Step-to pattern;Step-through pattern    Exercises Total Joint Exercises Ankle Circles/Pumps: AROM;Both;20 reps Quad Sets: AROM;Both;20 reps Heel Slides: AAROM;Left;20 reps Straight Leg Raises: AROM;AAROM;Left;20 reps   PT Diagnosis:    PT Problem List:   PT Treatment Interventions:     PT Goals Acute Rehab PT Goals PT Goal Formulation: With patient Time For Goal Achievement: 10/08/12 Potential to Achieve Goals: Good Pt  will go Supine/Side to Sit: with supervision Pt will go Sit to Supine/Side: with supervision Pt will go Sit to Stand: with supervision PT Goal: Sit to Stand - Progress: Progressing toward goal Pt will go Stand to Sit: with supervision PT Goal: Stand to Sit - Progress: Progressing toward goal Pt will Ambulate: with supervision;51 - 150 feet;with rolling walker PT Goal: Ambulate - Progress: Progressing toward goal Pt will Perform Home Exercise Program: with supervision, verbal cues required/provided PT Goal: Perform Home Exercise Program - Progress: Progressing toward goal  Visit Information  Last PT Received On: 10/03/12 Assistance Needed: +1    Subjective Data  Subjective: I had a hard night - they had to put the catheter back in Patient Stated Goal: rehab   Cognition  Overall Cognitive Status: Appears within functional limits for tasks assessed/performed Arousal/Alertness: Awake/alert Orientation Level: Appears intact for tasks assessed Behavior During Session: [facility] for tasks performed    Balance     End of Session PT - End of Session Activity Tolerance: Patient tolerated treatment well   GP     Derrick Figueroa 10/03/2012, 11:46 AM

## 2012-10-03 NOTE — Progress Notes (Signed)
Pt has been having issues with emptying bladder. Had not voided on current shift (7am to 12 pm). Bladder scan revealed >999 of urine. Foley placed and received 1400 cc of urine. Per hospital protocol, the foley was left in as this was the third time the pt had to be cath'd due to inability to empty bladder. PA paged.

## 2012-10-03 NOTE — Progress Notes (Signed)
Physical Therapy Treatment Patient Details Name: Derrick Figueroa MRN: [id number] DOB: [birth year] Today's Date: 10/03/2012 Time: 4098-1191 PT Time Calculation (min): 14 min  PT Assessment / Plan / Recommendation Comments on Treatment Session       Follow Up Recommendations  SNF     Does the patient have the potential to tolerate intense rehabilitation     Barriers to Discharge        Equipment Recommendations  Rolling walker with 5" wheels    Recommendations for Other Services    Frequency 7X/week   Plan Discharge plan remains appropriate;Frequency remains appropriate    Precautions / Restrictions Precautions Precautions: Knee Required Braces or Orthoses: Knee Immobilizer - Left Knee Immobilizer - Left: Discontinue once straight leg raise with < 10 degree lag (Pt able to perform IND SLR) Restrictions Weight Bearing Restrictions: No LLE Weight Bearing: Weight bearing as tolerated   Pertinent Vitals/Pain 3/10;     Mobility  Bed Mobility Bed Mobility: Supine to Sit;Sit to Supine Supine to Sit: 4: Min assist Sit to Supine: 4: Min assist Details for Bed Mobility Assistance: cues for technique, self assist Transfers Transfers: Sit to Stand;Stand to Sit Sit to Stand: 4: Min guard;With upper extremity assist;From bed Stand to Sit: 4: Min guard;With upper extremity assist;To bed Details for Transfer Assistance: cues for hand placement and wt shift Ambulation/Gait Ambulation/Gait Assistance: 4: Min guard;4: Min assist Ambulation Distance (Feet): 123 Feet (twice) Assistive device: Rolling walker Ambulation/Gait Assistance Details: min cues for posture and position from RW Gait Pattern: Step-to pattern;Step-through pattern    Exercises     PT Diagnosis:    PT Problem List:   PT Treatment Interventions:     PT Goals Acute Rehab PT Goals PT Goal Formulation: With patient Time For Goal Achievement: 10/08/12 Potential to Achieve Goals: Good Pt will go Supine/Side to Sit:  with supervision PT Goal: Supine/Side to Sit - Progress: Progressing toward goal Pt will go Sit to Supine/Side: with supervision PT Goal: Sit to Supine/Side - Progress: Progressing toward goal Pt will go Sit to Stand: with supervision PT Goal: Sit to Stand - Progress: Progressing toward goal Pt will go Stand to Sit: with supervision PT Goal: Stand to Sit - Progress: Progressing toward goal Pt will Ambulate: with supervision;51 - 150 feet;with rolling walker PT Goal: Ambulate - Progress: Progressing toward goal Pt will Perform Home Exercise Program: with supervision, verbal cues required/provided PT Goal: Perform Home Exercise Program - Progress: Progressing toward goal  Visit Information  Last PT Received On: 10/03/12 Assistance Needed: +1    Subjective Data  Subjective: I'm ready to walk Patient Stated Goal: rehab   Cognition  Overall Cognitive Status: Appears within functional limits for tasks assessed/performed Arousal/Alertness: Awake/alert Orientation Level: Appears intact for tasks assessed Behavior During Session: [facility] for tasks performed    Balance     End of Session PT - End of Session Activity Tolerance: Patient tolerated treatment well Patient left: in bed;with call bell/phone within reach   GP     [facility] 10/03/2012, 4:13 PM

## 2012-10-03 NOTE — Progress Notes (Signed)
   Subjective: 2 Days Post-Op Procedure(s) (LRB): TOTAL KNEE ARTHROPLASTY (Left) Patient reports pain as mild.  Had difficulty voiding and had to be catheterized x 2. Has had flatus and BM Plan is to go Rehab after hospital stay.  Objective: Vital signs in last 24 hours: Temp:  [97.6 F (36.4 C)-99 F (37.2 C)] 98.3 F (36.8 C) (01/22 0537) Pulse Rate:  [86-99] 88  (01/22 0537) Resp:  [16-18] 18  (01/22 0746) BP: (113-157)/(67-80) 138/70 mmHg (01/22 0537) SpO2:  [85 %-100 %] 98 % (01/22 0537)  Intake/Output from previous day:  Intake/Output Summary (Last 24 hours) at 10/03/12 0913 Last data filed at 10/03/12 0749  Gross per 24 hour  Intake   2173 ml  Output   4478 ml  Net  -2305 ml    Intake/Output this shift: Total I/O In: 360 [P.O.:360] Out: -   Labs:  Basename 10/03/12 0640 10/02/12 0445  HGB 9.8* 10.1*    Basename 10/03/12 0640 10/02/12 0445  WBC 8.3 5.9  RBC 2.97* 3.07*  HCT 27.9* 28.8*  PLT 158 162    Basename 10/03/12 0640 10/02/12 0445  NA 130* 126*  K 4.6 3.9  CL 97 92*  CO2 26 26  BUN 13 10  CREATININE 0.91 0.90  GLUCOSE 156* 109*  CALCIUM 8.8 8.2*   No results found for this basename: LABPT:2,INR:2 in the last 72 hours  EXAM General - Patient is Alert, Appropriate and Oriented Extremity - Neurologically intact Neurovascular intact Incision: dressing C/D/I No cellulitis present Compartment soft Dressing/Incision - clean, dry, no drainage Motor Function - intact, moving foot and toes well on exam.   Past Medical History  Diagnosis Date  . Hypertension   . Sinus complaint   . Arthritis   . Enlarged prostate   . Nocturia   . Diabetes mellitus     diet controlled  . Hearing difficulty   . Sleep apnea     STOP BANG SCORE 5    Assessment/Plan: 2 Days Post-Op Procedure(s) (LRB): TOTAL KNEE ARTHROPLASTY (Left) Active Problems:  OA (osteoarthritis) of knee   Advance diet Up with therapy D/C IV fluids Plan for discharge  tomorrow  DVT Prophylaxis - Xarelto Weight-Bearing as tolerated to left leg  Rushil Kimbrell V 10/03/2012, 9:13 AM

## 2012-10-03 NOTE — Progress Notes (Signed)
Pt's abd and scrotum largely distended.  Bladder scan done- >999.  In and out catheterization done w/ 1400cc results obtained.  Pt states pressure is relieved.

## 2012-10-04 DIAGNOSIS — R339 Retention of urine, unspecified: Secondary | ICD-10-CM | POA: Diagnosis not present

## 2012-10-04 LAB — CBC
MCH: 32.5 pg (ref 26.0–34.0)
MCHC: 34 g/dL (ref 30.0–36.0)
MCV: 95.5 fL (ref 78.0–100.0)
Platelets: 172 10*3/uL (ref 150–400)

## 2012-10-04 MED ORDER — DSS 100 MG PO CAPS: 100.0000 mg | ORAL_CAPSULE | Freq: Two times a day (BID) | ORAL | Status: AC

## 2012-10-04 MED ORDER — ONDANSETRON HCL 4 MG PO TABS: 4.0000 mg | ORAL_TABLET | Freq: Four times a day (QID) | ORAL | Status: DC | PRN

## 2012-10-04 MED ORDER — POLYSACCHARIDE IRON COMPLEX 150 MG PO CAPS: 150.0000 mg | ORAL_CAPSULE | Freq: Every day | ORAL | Status: AC

## 2012-10-04 MED ORDER — METHOCARBAMOL 500 MG PO TABS: 500.0000 mg | ORAL_TABLET | Freq: Four times a day (QID) | ORAL | Status: DC | PRN

## 2012-10-04 MED ORDER — BISACODYL 10 MG RE SUPP: 10.0000 mg | Freq: Every day | RECTAL | Status: AC | PRN

## 2012-10-04 MED ORDER — POLYETHYLENE GLYCOL 3350 17 G PO PACK: 17.0000 g | PACK | Freq: Every day | ORAL | Status: AC | PRN

## 2012-10-04 MED ORDER — POLYSACCHARIDE IRON COMPLEX 150 MG PO CAPS
150.0000 mg | ORAL_CAPSULE | Freq: Every day | ORAL | Status: DC
  Administered 2012-10-04: 150 mg via ORAL
  Filled 2012-10-04: qty 1

## 2012-10-04 MED ORDER — OXYCODONE HCL 5 MG PO TABS: 5.0000 mg | ORAL_TABLET | ORAL | Status: DC | PRN

## 2012-10-04 MED ORDER — TRAMADOL HCL 50 MG PO TABS: 50.0000 mg | ORAL_TABLET | Freq: Four times a day (QID) | ORAL | Status: DC | PRN

## 2012-10-04 MED ORDER — RIVAROXABAN 10 MG PO TABS: 10.0000 mg | ORAL_TABLET | Freq: Every day | ORAL | Status: DC

## 2012-10-04 NOTE — Progress Notes (Signed)
Clinical Social Work Department CLINICAL SOCIAL WORK PLACEMENT NOTE 10/04/2012  Patient:  Derrick Figueroa,Derrick Figueroa  Account Number:  [id number] Admit date:  10/01/2012  Clinical Social Worker:  Cori Razor, LCSW  Date/time:  10/02/2012 12:29 PM  Clinical Social Work is seeking post-discharge placement for this patient at the following level of care:   SKILLED NURSING   (*CSW will update this form in Epic as items are completed)     Patient/family provided with [facility] Department of Clinical Social Work's list of facilities offering this level of care within the geographic area requested by the patient (or if unable, by the patient's family).  10/02/2012  Patient/family informed of their freedom to choose among providers that offer the needed level of care, that participate in Medicare, Medicaid or managed care program needed by the patient, have an available bed and are willing to accept the patient.    Patient/family informed of [facility]' ownership interest in [facility], as well as of the fact that they are under no obligation to receive care at this facility.  PASARR submitted to [employer] on 10/02/2012 PASARR number received from [employer] on   FL2 transmitted to all facilities in geographic area requested by pt/family on  10/02/2012 FL2 transmitted to all facilities within larger geographic area on   Patient informed that his/her managed care company has contracts with or will negotiate with  certain facilities, including the following:     Patient/family informed of bed offers received:  10/02/2012 Patient chooses bed at [facility] Physician recommends and patient chooses bed at    Patient to be transferred to [facility] on  10/04/2012 Patient to be transferred to facility by [facility]  The following physician request were entered in Epic:   Additional Comments:  Cori Razor LCSW [phone]

## 2012-10-04 NOTE — Progress Notes (Signed)
   Subjective: 3 Days Post-Op Procedure(s) (LRB): TOTAL KNEE ARTHROPLASTY (Left) Patient reports pain as mild.   Patient seen in rounds with Dr. Lequita Halt. Patient is having problems with voiding.  He had to I&O cathed yesterday several times requiring them to put the foley back in. Patient is ready to go home so will convert the foley over to a leg bag.  He already has an appointment with his Urologist next week which he will need to keep.  Objective: Vital signs in last 24 hours: Temp:  [98.1 F (36.7 C)-98.9 F (37.2 C)] 98.1 F (36.7 C) (01/23 0508) Pulse Rate:  [80-109] 80  (01/23 0508) Resp:  [16-20] 16  (01/23 0508) BP: (135-155)/(61-81) 155/81 mmHg (01/23 0508) SpO2:  [94 %-99 %] 94 % (01/23 0508)  Intake/Output from previous day:  Intake/Output Summary (Last 24 hours) at 10/04/12 0734 Last data filed at 10/04/12 0508  Gross per 24 hour  Intake    960 ml  Output   3175 ml  Net  -2215 ml    Labs:  Basename 10/04/12 0457 10/03/12 0640 10/02/12 0445  HGB 8.7* 9.8* 10.1*    Basename 10/04/12 0457 10/03/12 0640  WBC 5.6 8.3  RBC 2.68* 2.97*  HCT 25.6* 27.9*  PLT 172 158    Basename 10/03/12 0640 10/02/12 0445  NA 130* 126*  K 4.6 3.9  CL 97 92*  CO2 26 26  BUN 13 10  CREATININE 0.91 0.90  GLUCOSE 156* 109*  CALCIUM 8.8 8.2*   No results found for this basename: LABPT:2,INR:2 in the last 72 hours  EXAM: General - Patient is Alert, Appropriate and Oriented Extremity - Neurovascular intact Sensation intact distally Dorsiflexion/Plantar flexion intact No cellulitis present Incision - clean, dry, no drainage, healing Motor Function - intact, moving foot and toes well on exam.   Assessment/Plan: 3 Days Post-Op Procedure(s) (LRB): TOTAL KNEE ARTHROPLASTY (Left) Procedure(s) (LRB): TOTAL KNEE ARTHROPLASTY (Left) Past Medical History  Diagnosis Date  . Hypertension   . Sinus complaint   . Arthritis   . Enlarged prostate   . Nocturia   . Diabetes  mellitus     diet controlled  . Hearing difficulty   . Sleep apnea     STOP BANG SCORE 5   Active Problems:  OA (osteoarthritis) of knee  Postop Hyponatremia  Postop Acute blood loss anemia  Estimated Body mass index is 28.98 kg/(m^2) as calculated from the following:   Height as of this encounter: 5\' 10" (1.778 m).   Weight as of this encounter: 202 lb(91.627 kg). Up with therapy Discharge to SNF Diet - Cardiac diet and Diabetic diet Follow up - in 2 weeks with Dr. Lequita Halt.  He will need to follow up with his urologist next week. Activity - WBAT Disposition - Skilled nursing facility Condition Upon Discharge - Stable D/C Meds - See DC Summary DVT Prophylaxis - Xarelto  Derrick Figueroa 10/04/2012, 7:34 AM

## 2012-10-04 NOTE — Discharge Summary (Signed)
Physician Discharge Summary   Patient ID: Derrick Figueroa MRN: [id number] DOB/AGE: 02-23-1936 77 y.o.  Admit date: 10/01/2012 Discharge date: 10/04/2012  Primary Diagnosis: Osteoarthritis Left knee  Admission Diagnoses:  Past Medical History  Diagnosis Date  . Hypertension   . Sinus complaint   . Arthritis   . Enlarged prostate   . Nocturia   . Diabetes mellitus     diet controlled  . Hearing difficulty   . Sleep apnea     STOP BANG SCORE 5   Discharge Diagnoses:   Active Problems:  OA (osteoarthritis) of knee  Postop Hyponatremia  Postop Acute blood loss anemia  Urinary retention  Estimated Body mass index is 28.98 kg/(m^2) as calculated from the following:   Height as of this encounter: 5\' 10" (1.778 m).   Weight as of this encounter: 202 lb(91.627 kg).  Classification of overweight in adults according to BMI (WHO, 1998)   Procedure:  Procedure(s) (LRB): TOTAL KNEE ARTHROPLASTY (Left)   Consults: None  HPI: Derrick Figueroa is a 77 y.o. year old male with end stage OA of his left knee with progressively worsening pain and dysfunction. He has constant pain, with activity and at rest and significant functional deficits with difficulties even with ADLs. He has had extensive non-op management including analgesics, injections of cortisone and viscosupplements, and home exercise program, but remains in significant pain with significant dysfunction. Radiographs show bone on bone arthritis medial and patellofemoral. He presents now for left Total Knee Arthroplasty.   Laboratory Data: Admission on 10/01/2012  Component Date Value Range Status  . ABO/RH(D) 10/01/2012 O POS   Final  . Antibody Screen 10/01/2012 NEG   Final  . Sample Expiration 10/01/2012 10/04/2012   Final  . Glucose-Capillary 10/01/2012 114* 70 - 99 mg/dL Final  . Comment 1 40/98/1191 Documented in Chart   Final  . Glucose-Capillary 10/01/2012 133* 70 - 99 mg/dL Final  . Comment 1 47/82/9562 Documented in Chart    Final  . Comment 2 10/01/2012 Notify RN   Final  . Glucose-Capillary 10/01/2012 148* 70 - 99 mg/dL Final  . WBC 13/04/6577 5.9  4.0 - 10.5 K/uL Final  . RBC 10/02/2012 3.07* 4.22 - 5.81 MIL/uL Final  . Hemoglobin 10/02/2012 10.1* 13.0 - 17.0 g/dL Final  . HCT 46/96/2952 28.8* 39.0 - 52.0 % Final  . MCV 10/02/2012 93.8  78.0 - 100.0 fL Final  . MCH 10/02/2012 32.9  26.0 - 34.0 pg Final  . MCHC 10/02/2012 35.1  30.0 - 36.0 g/dL Final  . RDW 84/13/2440 12.8  11.5 - 15.5 % Final  . Platelets 10/02/2012 162  150 - 400 K/uL Final  . Sodium 10/02/2012 126* 135 - 145 mEq/L Final  . Potassium 10/02/2012 3.9  3.5 - 5.1 mEq/L Final  . Chloride 10/02/2012 92* 96 - 112 mEq/L Final  . CO2 10/02/2012 26  19 - 32 mEq/L Final  . Glucose, Bld 10/02/2012 109* 70 - 99 mg/dL Final  . BUN 07/09/2535 10  6 - 23 mg/dL Final  . Creatinine, Ser 10/02/2012 0.90  0.50 - 1.35 mg/dL Final  . Calcium 64/40/3474 8.2* 8.4 - 10.5 mg/dL Final  . GFR calc non Af Amer 10/02/2012 82* >90 mL/min Final  . GFR calc Af Amer 10/02/2012 >90  >90 mL/min Final   Comment:                                 The  eGFR has been calculated                          using the CKD EPI equation.                          This calculation has not been                          validated in all clinical                          situations.                          eGFR's persistently                          <90 mL/min signify                          possible Chronic Kidney Disease.  . Glucose-Capillary 10/01/2012 218* 70 - 99 mg/dL Final  . Glucose-Capillary 10/01/2012 161* 70 - 99 mg/dL Final  . Comment 1 16/06/9603 Notify RN   Final  . Glucose-Capillary 10/02/2012 116* 70 - 99 mg/dL Final  . Comment 1 54/05/8118 Notify RN   Final  . Glucose-Capillary 10/02/2012 153* 70 - 99 mg/dL Final  . Comment 1 14/78/2956 Notify RN   Final  . Comment 2 10/02/2012 Documented in Chart   Final  . Glucose-Capillary 10/02/2012 226* 70 - 99 mg/dL Final    . WBC 21/30/8657 8.3  4.0 - 10.5 K/uL Final  . RBC 10/03/2012 2.97* 4.22 - 5.81 MIL/uL Final  . Hemoglobin 10/03/2012 9.8* 13.0 - 17.0 g/dL Final  . HCT 84/69/6295 27.9* 39.0 - 52.0 % Final  . MCV 10/03/2012 93.9  78.0 - 100.0 fL Final  . MCH 10/03/2012 33.0  26.0 - 34.0 pg Final  . MCHC 10/03/2012 35.1  30.0 - 36.0 g/dL Final  . RDW 28/41/3244 12.9  11.5 - 15.5 % Final  . Platelets 10/03/2012 158  150 - 400 K/uL Final  . Sodium 10/03/2012 130* 135 - 145 mEq/L Final  . Potassium 10/03/2012 4.6  3.5 - 5.1 mEq/L Final  . Chloride 10/03/2012 97  96 - 112 mEq/L Final  . CO2 10/03/2012 26  19 - 32 mEq/L Final  . Glucose, Bld 10/03/2012 156* 70 - 99 mg/dL Final  . BUN 09/14/7251 13  6 - 23 mg/dL Final  . Creatinine, Ser 10/03/2012 0.91  0.50 - 1.35 mg/dL Final  . Calcium 66/44/0347 8.8  8.4 - 10.5 mg/dL Final  . GFR calc non Af Amer 10/03/2012 81* >90 mL/min Final  . GFR calc Af Amer 10/03/2012 >90  >90 mL/min Final   Comment:                                 The eGFR has been calculated                          using the CKD EPI equation.                          This  calculation has not been                          validated in all clinical                          situations.                          eGFR's persistently                          <90 mL/min signify                          possible Chronic Kidney Disease.  . Glucose-Capillary 10/02/2012 176* 70 - 99 mg/dL Final  . Glucose-Capillary 10/03/2012 145* 70 - 99 mg/dL Final  . Glucose-Capillary 10/03/2012 145* 70 - 99 mg/dL Final  . WBC 47/82/9562 5.6  4.0 - 10.5 K/uL Final  . RBC 10/04/2012 2.68* 4.22 - 5.81 MIL/uL Final  . Hemoglobin 10/04/2012 8.7* 13.0 - 17.0 g/dL Final  . HCT 13/04/6577 25.6* 39.0 - 52.0 % Final  . MCV 10/04/2012 95.5  78.0 - 100.0 fL Final  . MCH 10/04/2012 32.5  26.0 - 34.0 pg Final  . MCHC 10/04/2012 34.0  30.0 - 36.0 g/dL Final  . RDW 46/96/2952 13.1  11.5 - 15.5 % Final  . Platelets 10/04/2012  172  150 - 400 K/uL Final  . Glucose-Capillary 10/03/2012 106* 70 - 99 mg/dL Final  . Glucose-Capillary 10/03/2012 127* 70 - 99 mg/dL Final  . Glucose-Capillary 10/04/2012 108* 70 - 99 mg/dL Final  . Comment 1 84/13/2440 Documented in Chart   Final  . Comment 2 10/04/2012 Notify RN   Final  Hospital Outpatient Visit on 09/25/2012  Component Date Value Range Status  . MRSA, PCR 09/25/2012 NEGATIVE  NEGATIVE Final  . Staphylococcus aureus 09/25/2012 NEGATIVE  NEGATIVE Final   Comment:                                 The Xpert SA Assay (FDA                          approved for NASAL specimens                          in patients over 61 years of age),                          is one component of                          a comprehensive surveillance                          program.  Test performance has                          been validated by [employer] for patients greater  than or equal to 89 year old.                          It is not intended                          to diagnose infection nor to                          guide or monitor treatment.  Marland Kitchen aPTT 09/25/2012 28  24 - 37 seconds Final  . WBC 09/25/2012 6.2  4.0 - 10.5 K/uL Final  . RBC 09/25/2012 4.12* 4.22 - 5.81 MIL/uL Final  . Hemoglobin 09/25/2012 13.3  13.0 - 17.0 g/dL Final  . HCT 40/98/1191 38.3* 39.0 - 52.0 % Final  . MCV 09/25/2012 93.0  78.0 - 100.0 fL Final  . MCH 09/25/2012 32.3  26.0 - 34.0 pg Final  . MCHC 09/25/2012 34.7  30.0 - 36.0 g/dL Final  . RDW 47/82/9562 12.4  11.5 - 15.5 % Final  . Platelets 09/25/2012 205  150 - 400 K/uL Final  . Sodium 09/25/2012 130* 135 - 145 mEq/L Final  . Potassium 09/25/2012 4.6  3.5 - 5.1 mEq/L Final  . Chloride 09/25/2012 93* 96 - 112 mEq/L Final  . CO2 09/25/2012 28  19 - 32 mEq/L Final  . Glucose, Bld 09/25/2012 154* 70 - 99 mg/dL Final  . BUN 13/04/6577 12  6 - 23 mg/dL Final  . Creatinine, Ser 09/25/2012 0.86   0.50 - 1.35 mg/dL Final  . Calcium 46/96/2952 9.3  8.4 - 10.5 mg/dL Final  . Total Protein 09/25/2012 7.3  6.0 - 8.3 g/dL Final  . Albumin 84/13/2440 3.7  3.5 - 5.2 g/dL Final  . AST 07/09/2535 31  0 - 37 U/L Final  . ALT 09/25/2012 23  0 - 53 U/L Final  . Alkaline Phosphatase 09/25/2012 73  39 - 117 U/L Final  . Total Bilirubin 09/25/2012 0.4  0.3 - 1.2 mg/dL Final  . GFR calc non Af Amer 09/25/2012 83* >90 mL/min Final  . GFR calc Af Amer 09/25/2012 >90  >90 mL/min Final   Comment:                                 The eGFR has been calculated                          using the CKD EPI equation.                          This calculation has not been                          validated in all clinical                          situations.                          eGFR's persistently                          <90 mL/min signify  possible Chronic Kidney Disease.  Marland Kitchen Prothrombin Time 09/25/2012 13.4  11.6 - 15.2 seconds Final  . INR 09/25/2012 1.03  0.00 - 1.49 Final  . Color, Urine 09/25/2012 YELLOW  YELLOW Final  . APPearance 09/25/2012 CLEAR  CLEAR Final  . Specific Gravity, Urine 09/25/2012 1.007  1.005 - 1.030 Final  . pH 09/25/2012 6.5  5.0 - 8.0 Final  . Glucose, UA 09/25/2012 500* NEGATIVE mg/dL Final  . Hgb urine dipstick 09/25/2012 NEGATIVE  NEGATIVE Final  . Bilirubin Urine 09/25/2012 NEGATIVE  NEGATIVE Final  . Ketones, ur 09/25/2012 NEGATIVE  NEGATIVE mg/dL Final  . Protein, ur 16/06/9603 NEGATIVE  NEGATIVE mg/dL Final  . Urobilinogen, UA 09/25/2012 0.2  0.0 - 1.0 mg/dL Final  . Nitrite 54/05/8118 NEGATIVE  NEGATIVE Final  . Leukocytes, UA 09/25/2012 NEGATIVE  NEGATIVE Final   MICROSCOPIC NOT DONE ON URINES WITH NEGATIVE PROTEIN, BLOOD, LEUKOCYTES, NITRITE, OR GLUCOSE <1000 mg/dL.     X-Rays:No results found.  EKG: Orders placed during the hospital encounter of 03/19/12  . EKG     Hospital Course: Aydien Majette is a 77 y.o. who was admitted to  [facility]. They were brought to the operating room on 10/01/2012 and underwent Procedure(s): TOTAL KNEE ARTHROPLASTY.  Patient tolerated the procedure well and was later transferred to the recovery room and then to the orthopaedic floor for postoperative care.  They were given PO and IV analgesics for pain control following their surgery.  They were given 24 hours of postoperative antibiotics of  Anti-infectives     Start     Dose/Rate Route Frequency Ordered Stop   10/01/12 1530   ceFAZolin (ANCEF) IVPB 2 g/50 mL premix        2 g 100 mL/hr over 30 Minutes Intravenous Every 6 hours 10/01/12 1235 10/01/12 2208   10/01/12 0728   ceFAZolin (ANCEF) IVPB 2 g/50 mL premix        2 g 100 mL/hr over 30 Minutes Intravenous 60 min pre-op 10/01/12 0728 10/01/12 0913         and started on DVT prophylaxis in the form of Xarelto.   PT and OT were ordered for total joint protocol.  Discharge planning consulted to help with postop disposition and equipment needs.  Patient had a decent night on the evening of surgery and started to get up OOB with therapy on day one walking about 80 feet. Hemovac drain was pulled without difficulty.  Continued to work with therapy into day two.  Dressing was changed on day two and the incision was healing well. Had difficulty voiding and had to be catheterized x 3.  After the third time, the foley was put back in to gravity. Had flatus and BM by this time.  By day three, the patient had progressed with therapy and meeting their goals and walking over 100 feet.  Incision was healing well.  Patient was seen in rounds and was ready to go to [place] [facility] [facility].  He told Dr. Lequita Halt that he already had an appointment with Dr. Wendall Stade, his Urologist set up for next week.  The foley was converted over to a leg bag and the patient instructed to leave in place until his follow up appointment with his Urologist on 10/10/2012.  Discharge Medications: Prior to Admission  medications   Medication Sig Start Date End Date Taking? Authorizing Provider  acetaminophen (TYLENOL) 325 MG tablet Take 2 tablets (650 mg total) by mouth every 6 (six) hours as needed (  or Fever >/= 101). 03/22/12 03/22/13 Yes Brendolyn Stockley Julien Girt, PA  amLODipine (NORVASC) 5 MG tablet Take 5 mg by mouth at bedtime.    Yes Historical Provider, MD  atenolol (TENORMIN) 25 MG tablet Take 25 mg by mouth at bedtime.    Yes Historical Provider, MD  betamethasone valerate (VALISONE) 0.1 % cream Apply 1 application topically 2 (two) times daily.   Yes Historical Provider, MD  fluticasone (FLONASE) 50 MCG/ACT nasal spray Place 2 sprays into the nose daily.   Yes Historical Provider, MD  lisinopril (PRINIVIL,ZESTRIL) 20 MG tablet Take 20 mg by mouth 2 (two) times daily.   Yes Historical Provider, MD  loratadine (CLARITIN) 10 MG tablet Take 10 mg by mouth daily.   Yes Historical Provider, MD  terazosin (HYTRIN) 5 MG capsule Take 5 mg by mouth at bedtime.   Yes Historical Provider, MD  bisacodyl (DULCOLAX) 10 MG suppository Place 1 suppository (10 mg total) rectally daily as needed. 10/04/12   Lani Mendiola, PA  docusate sodium 100 MG CAPS Take 100 mg by mouth 2 (two) times daily. 10/04/12   Katherinne Mofield Julien Girt, PA  iron polysaccharides (NIFEREX) 150 MG capsule Take 1 capsule (150 mg total) by mouth daily. 10/04/12   Darchelle Nunes Julien Girt, PA  methocarbamol (ROBAXIN) 500 MG tablet Take 1 tablet (500 mg total) by mouth every 6 (six) hours as needed. 10/04/12   Hayslee Casebolt, PA  ondansetron (ZOFRAN) 4 MG tablet Take 1 tablet (4 mg total) by mouth every 6 (six) hours as needed for nausea. 10/04/12   Trayden Brandy, PA  oxyCODONE (OXY IR/ROXICODONE) 5 MG immediate release tablet Take 1-2 tablets (5-10 mg total) by mouth every 3 (three) hours as needed. 10/04/12   Bray Vickerman, PA  polyethylene glycol (MIRALAX / GLYCOLAX) packet Take 17 g by mouth daily as needed. 10/04/12   Hawke Villalpando Julien Girt, PA    rivaroxaban (XARELTO) 10 MG TABS tablet Take 1 tablet (10 mg total) by mouth daily with breakfast. Take Xarelto for two and a half more weeks, then discontinue Xarelto. 10/04/12   Ryland Smoots Julien Girt, PA  traMADol (ULTRAM) 50 MG tablet Take 1-2 tablets (50-100 mg total) by mouth every 6 (six) hours as needed (mild pain). 10/04/12   Houa Nie Julien Girt, PA    Diet: Cardiac diet and Diabetic diet Activity:WBAT Follow-up: in 2 weeks with Dr. Lequita Halt. He will need to follow up with his urologist next week. Disposition - Skilled nursing facility - [place] [facility] Discharged Condition: stable   Discharge Orders    Future Orders Please Complete By Expires   Diet - low sodium heart healthy      Diet Carb Modified      Call MD / Call 911      Comments:   If you experience chest pain or shortness of breath, CALL 911 and be transported to the hospital emergency room.  If you develope a fever above 101 F, pus (white drainage) or increased drainage or redness at the wound, or calf pain, call your surgeon's office.   Discharge instructions      Comments:   Pick up stool softner and laxative for home. Do not submerge incision under water. May shower. Continue to use ice for pain and swelling from surgery.  Take Xarelto for two and a half more weeks, then discontinue Xarelto.   Constipation Prevention      Comments:   Drink plenty of fluids.  Prune juice may be helpful.  You may use a stool softener, such as Colace (  over the counter) 100 mg twice a day.  Use MiraLax (over the counter) for constipation as needed.   Increase activity slowly as tolerated      Patient may shower      Comments:   You may shower without a dressing once there is no drainage.  Do not wash over the wound.  If drainage remains, do not shower until drainage stops.   Weight bearing as tolerated      Driving restrictions      Comments:   No driving until released by the physician.   Lifting restrictions       Comments:   No lifting until released by the physician.   TED hose      Comments:   Use stockings (TED hose) for 3 weeks on both leg(s).  You may remove them at night for sleeping.   Change dressing      Comments:   Change dressing daily with sterile 4 x 4 inch gauze dressing and apply TED hose. Do not submerge the incision under water.   Do not put a pillow under the knee. Place it under the heel.      Do not sit on low chairs, stoools or toilet seats, as it may be difficult to get up from low surfaces          Medication List     As of 10/04/2012  7:55 AM    STOP taking these medications         calcium carbonate 500 MG chewable tablet   Commonly known as: TUMS - dosed in mg elemental calcium      TAKE these medications         acetaminophen 325 MG tablet   Commonly known as: TYLENOL   Take 2 tablets (650 mg total) by mouth every 6 (six) hours as needed (or Fever >/= 101).      amLODipine 5 MG tablet   Commonly known as: NORVASC   Take 5 mg by mouth at bedtime.      atenolol 25 MG tablet   Commonly known as: TENORMIN   Take 25 mg by mouth at bedtime.      betamethasone valerate 0.1 % cream   Commonly known as: VALISONE   Apply 1 application topically 2 (two) times daily.      bisacodyl 10 MG suppository   Commonly known as: DULCOLAX   Place 1 suppository (10 mg total) rectally daily as needed.      DSS 100 MG Caps   Take 100 mg by mouth 2 (two) times daily.      fluticasone 50 MCG/ACT nasal spray   Commonly known as: FLONASE   Place 2 sprays into the nose daily.      iron polysaccharides 150 MG capsule   Commonly known as: NIFEREX   Take 1 capsule (150 mg total) by mouth daily.      lisinopril 20 MG tablet   Commonly known as: PRINIVIL,ZESTRIL   Take 20 mg by mouth 2 (two) times daily.      loratadine 10 MG tablet   Commonly known as: CLARITIN   Take 10 mg by mouth daily.      methocarbamol 500 MG tablet   Commonly known as: ROBAXIN   Take 1 tablet  (500 mg total) by mouth every 6 (six) hours as needed.      ondansetron 4 MG tablet   Commonly known as: ZOFRAN   Take 1 tablet (4 mg total)  by mouth every 6 (six) hours as needed for nausea.      oxyCODONE 5 MG immediate release tablet   Commonly known as: Oxy IR/ROXICODONE   Take 1-2 tablets (5-10 mg total) by mouth every 3 (three) hours as needed.      polyethylene glycol packet   Commonly known as: MIRALAX / GLYCOLAX   Take 17 g by mouth daily as needed.      rivaroxaban 10 MG Tabs tablet   Commonly known as: XARELTO   Take 1 tablet (10 mg total) by mouth daily with breakfast. Take Xarelto for two and a half more weeks, then discontinue Xarelto.      terazosin 5 MG capsule   Commonly known as: HYTRIN   Take 5 mg by mouth at bedtime.      traMADol 50 MG tablet   Commonly known as: ULTRAM   Take 1-2 tablets (50-100 mg total) by mouth every 6 (six) hours as needed (mild pain).           Follow-up Information    Follow up with Loanne Drilling, MD. Schedule an appointment as soon as possible for a visit in 2 weeks.   Contact information:   [address] [place] [address] [phone]       Follow up with Dr. Wendall Stade. (Patient has appointment next week on 10/10/2012.)    Contact information:   [facility] [address]  [place], [place] [address]           Signed: Patrica Duel 10/04/2012, 7:55 AM

## 2015-01-26 ENCOUNTER — Other Ambulatory Visit (HOSPITAL_COMMUNITY): Payer: Self-pay | Admitting: Internal Medicine

## 2015-01-26 DIAGNOSIS — I639 Cerebral infarction, unspecified: Secondary | ICD-10-CM

## 2015-01-27 ENCOUNTER — Other Ambulatory Visit (HOSPITAL_COMMUNITY): Payer: Self-pay | Admitting: Interventional Radiology

## 2015-01-27 ENCOUNTER — Ambulatory Visit (HOSPITAL_COMMUNITY)
Admission: RE | Admit: 2015-01-27 | Discharge: 2015-01-27 | Disposition: A | Discharge status: Home / Self Care | Payer: Medicare Other | Source: Ambulatory Visit | Attending: Internal Medicine | Admitting: Internal Medicine

## 2015-01-27 DIAGNOSIS — I639 Cerebral infarction, unspecified: Secondary | ICD-10-CM

## 2015-02-05 ENCOUNTER — Other Ambulatory Visit: Payer: Self-pay | Admitting: Radiology

## 2015-02-06 ENCOUNTER — Other Ambulatory Visit: Payer: Self-pay | Admitting: Radiology

## 2015-02-10 ENCOUNTER — Encounter (HOSPITAL_COMMUNITY): Payer: Self-pay

## 2015-02-10 ENCOUNTER — Ambulatory Visit (HOSPITAL_COMMUNITY)
Admission: RE | Admit: 2015-02-10 | Discharge: 2015-02-10 | Disposition: A | Discharge status: Home / Self Care | Payer: Medicare Other | Source: Ambulatory Visit | Attending: Interventional Radiology | Admitting: Interventional Radiology

## 2015-02-10 DIAGNOSIS — Z79899 Other long term (current) drug therapy: Secondary | ICD-10-CM | POA: Insufficient documentation

## 2015-02-10 DIAGNOSIS — R21 Rash and other nonspecific skin eruption: Secondary | ICD-10-CM | POA: Insufficient documentation

## 2015-02-10 DIAGNOSIS — R42 Dizziness and giddiness: Secondary | ICD-10-CM | POA: Diagnosis present

## 2015-02-10 DIAGNOSIS — M1990 Unspecified osteoarthritis, unspecified site: Secondary | ICD-10-CM | POA: Diagnosis not present

## 2015-02-10 DIAGNOSIS — G4730 Sleep apnea, unspecified: Secondary | ICD-10-CM | POA: Insufficient documentation

## 2015-02-10 DIAGNOSIS — I6503 Occlusion and stenosis of bilateral vertebral arteries: Secondary | ICD-10-CM | POA: Diagnosis not present

## 2015-02-10 DIAGNOSIS — Z7982 Long term (current) use of aspirin: Secondary | ICD-10-CM | POA: Insufficient documentation

## 2015-02-10 DIAGNOSIS — I10 Essential (primary) hypertension: Secondary | ICD-10-CM | POA: Diagnosis not present

## 2015-02-10 DIAGNOSIS — I639 Cerebral infarction, unspecified: Secondary | ICD-10-CM | POA: Insufficient documentation

## 2015-02-10 DIAGNOSIS — Z87891 Personal history of nicotine dependence: Secondary | ICD-10-CM | POA: Insufficient documentation

## 2015-02-10 DIAGNOSIS — Z7902 Long term (current) use of antithrombotics/antiplatelets: Secondary | ICD-10-CM | POA: Diagnosis not present

## 2015-02-10 DIAGNOSIS — E119 Type 2 diabetes mellitus without complications: Secondary | ICD-10-CM | POA: Insufficient documentation

## 2015-02-10 DIAGNOSIS — H9190 Unspecified hearing loss, unspecified ear: Secondary | ICD-10-CM | POA: Diagnosis not present

## 2015-02-10 LAB — BASIC METABOLIC PANEL
Anion gap: 11 (ref 5–15)
BUN: 7 mg/dL (ref 6–20)
CALCIUM: 9.4 mg/dL (ref 8.9–10.3)
CO2: 25 mmol/L (ref 22–32)
Chloride: 99 mmol/L — ABNORMAL LOW (ref 101–111)
Creatinine, Ser: 0.86 mg/dL (ref 0.61–1.24)
GFR calc Af Amer: >60 mL/min (ref >60)
GFR calc non Af Amer: >60 mL/min (ref >60)
Glucose, Bld: 163 mg/dL — ABNORMAL HIGH (ref 65–99)
POTASSIUM: 4.5 mmol/L (ref 3.5–5.1)
Sodium: 135 mmol/L (ref 135–145)

## 2015-02-10 LAB — CBC
HCT: 38.4 % — ABNORMAL LOW (ref 39.0–52.0)
Hemoglobin: 13.6 g/dL (ref 13.0–17.0)
MCH: 33.8 pg (ref 26.0–34.0)
MCHC: 35.4 g/dL (ref 30.0–36.0)
MCV: 95.5 fL (ref 78.0–100.0)
PLATELETS: 190 10*3/uL (ref 150–400)
RBC: 4.02 MIL/uL — ABNORMAL LOW (ref 4.22–5.81)
RDW: 12.7 % (ref 11.5–15.5)
WBC: 5.4 10*3/uL (ref 4.0–10.5)

## 2015-02-10 LAB — APTT: APTT: 31 s (ref 24–37)

## 2015-02-10 LAB — PROTIME-INR
INR: 1.06 (ref 0.00–1.49)
PROTHROMBIN TIME: 14 s (ref 11.6–15.2)

## 2015-02-10 MED ORDER — HEPARIN SOD (PORK) LOCK FLUSH 100 UNIT/ML IV SOLN
INTRAVENOUS | Status: AC
  Filled 2015-02-10: qty 25

## 2015-02-10 MED ORDER — FENTANYL CITRATE (PF) 100 MCG/2ML IJ SOLN
INTRAMUSCULAR | Status: AC
  Filled 2015-02-10: qty 2

## 2015-02-10 MED ORDER — MIDAZOLAM HCL 2 MG/2ML IJ SOLN
INTRAMUSCULAR | Status: AC
  Filled 2015-02-10: qty 2

## 2015-02-10 MED ORDER — SODIUM CHLORIDE 0.9 % IV SOLN
Freq: Once | INTRAVENOUS | Status: AC
  Administered 2015-02-10: 10:00:00 via INTRAVENOUS

## 2015-02-10 NOTE — Progress Notes (Signed)
Patient ID: Derrick Figueroa, male   DOB: 02/22/1936, 79 y.o.   MRN: [id number] Patient scheduled for diagnostic catheter angiogram for  VBI and Rt PICA infarct ,with Bilateral VA stenosis . On exam has a worsening macular papular rash on his face ,neck,and back of head..Says its itchy. Apparrently not obvious when patient seen in the clinic a few days ago.Marland Kitchen. Has breathing fficulties ,though not incapacitating. Plan . In view of the above will temporarily postpone the cath angio until the facial and neck maculo papular rash  has been evaluated.. . Discussed with patient ,who understands .Will schedule appointment with primary MD. Dr Corliss Skainseveshwar

## 2015-02-10 NOTE — Consult Note (Signed)
Chief Complaint: Dizziness; instability  Referring Physician(s): Deveshwar,Sanjeev  History of Present Illness: Derrick Figueroa is a 79 y.o. male   Pt suffered CVA 01/13/2015 At home noted dizziness; gait instability and blurred vision Evaluation by PMD included MRI and CTA +CVA + B vertebral artery stenosis Consulted with Dr Corliss Skains last week Now scheduled for diagnostic cerebral arteriogram  Past Medical History  Diagnosis Date  . Hypertension   . Sinus complaint   . Arthritis   . Enlarged prostate   . Nocturia   . Diabetes mellitus     diet controlled  . Hearing difficulty   . Sleep apnea     STOP BANG SCORE 5    Past Surgical History  Procedure Laterality Date  . Prostate biopsy    . Colonoscopy    . Total knee arthroplasty  03/19/2012    Procedure: TOTAL KNEE ARTHROPLASTY;  Surgeon: Loanne Drilling, MD;  Location: [facility];  Service: Orthopedics;  Laterality: Right;  . Total knee arthroplasty  10/01/2012    Procedure: TOTAL KNEE ARTHROPLASTY;  Surgeon: Loanne Drilling, MD;  Location: [facility];  Service: Orthopedics;  Laterality: Left;    Allergies: Other and Sulfa antibiotics  Medications: Prior to Admission medications   Medication Sig Start Date End Date Taking? Authorizing Provider  amLODipine (NORVASC) 5 MG tablet Take 5 mg by mouth at bedtime.    Yes Historical Provider, MD  aspirin 325 MG tablet Take 325 mg by mouth daily.   Yes Historical Provider, MD  atenolol (TENORMIN) 25 MG tablet Take 25 mg by mouth at bedtime.    Yes Historical Provider, MD  betamethasone valerate (VALISONE) 0.1 % cream Apply 1 application topically 2 (two) times daily.   Yes Historical Provider, MD  bisacodyl (DULCOLAX) 10 MG suppository Place 1 suppository (10 mg total) rectally daily as needed. 10/04/12  Yes Avel Peace, PA-C  clopidogrel (PLAVIX) 75 MG tablet Take 75 mg by mouth daily.   Yes Historical Provider, MD  diphenhydrAMINE (BENADRYL) 25 MG tablet Take 25 mg by mouth  every 6 (six) hours as needed for itching or allergies.   Yes Historical Provider, MD  docusate sodium 100 MG CAPS Take 100 mg by mouth 2 (two) times daily. 10/04/12  Yes Avel Peace, PA-C  gabapentin (NEURONTIN) 100 MG capsule Take 100 mg by mouth daily.   Yes Historical Provider, MD  lisinopril (PRINIVIL,ZESTRIL) 20 MG tablet Take 20 mg by mouth 2 (two) times daily.   Yes Historical Provider, MD  meloxicam (MOBIC) 15 MG tablet Take 15 mg by mouth daily.   Yes Historical Provider, MD  tamsulosin (FLOMAX) 0.4 MG CAPS capsule Take 0.4 mg by mouth daily.   Yes Historical Provider, MD  iron polysaccharides (NIFEREX) 150 MG capsule Take 1 capsule (150 mg total) by mouth daily. Patient not taking: Reported on 02/05/2015 10/04/12   Avel Peace, PA-C  polyethylene glycol [facility] Ethelene Hal) packet Take 17 g by mouth daily as needed. 10/04/12   Avel Peace, PA-C     History reviewed. No pertinent family history.  History   Social History  . Marital Status: Single    Spouse Name: N/A  . Number of Children: N/A  . Years of Education: N/A   Social History Main Topics  . Smoking status: Former Smoker    Quit date: 03/08/1990  . Smokeless tobacco: Not on file  . Alcohol Use: Yes     Comment: 6-8 beers per day  . Drug Use: No  .  Sexual Activity: Not on file   Other Topics Concern  . None   Social History Narrative    Review of Systems: A 12 point ROS discussed and pertinent positives are indicated in the HPI above.  All other systems are negative.  Review of Systems  Constitutional: Positive for activity change and fatigue. Negative for fever and appetite change.  HENT: Negative for tinnitus, trouble swallowing and voice change.   Respiratory: Negative for chest tightness and shortness of breath.   Cardiovascular: Negative for chest pain.  Gastrointestinal: Negative for abdominal pain.  Musculoskeletal: Positive for gait problem. Negative for back pain and neck stiffness.    Neurological: Positive for dizziness, weakness and light-headedness. Negative for seizures, syncope, facial asymmetry, speech difficulty, numbness and headaches.  Psychiatric/Behavioral: Negative for behavioral problems and confusion.    Vital Signs: BP 178/82 mmHg  Pulse 106  Temp(Src) 97.9 F (36.6 C)  Resp 20  Ht $R [id number]$5\' 10"  SpO2 96%  Physical Exam  Constitutional: He appears well-nourished.  Cardiovascular: Normal rate, regular rhythm and normal heart sounds.   Pulmonary/Chest: Effort normal and breath sounds normal. He has no wheezes.  Abdominal: Soft. He exhibits distension. There is no tenderness.  Musculoskeletal: Normal range of motion. He exhibits no edema or tenderness.  Neurological: He is alert.  Skin: Skin is warm and dry.  Psychiatric: He has a normal mood and affect. His behavior is normal. Judgment and thought content normal.  Nursing note and vitals reviewed.   Mallampati Score:  MD Evaluation Airway: WNL Heart: WNL Abdomen: WNL Chest/ Lungs: WNL ASA  Classification: 3 Mallampati/Airway Score: One  Imaging: Ir Radiologist Eval & Mgmt  01/30/2015   EXAM: NEW PATIENT OFFICE VISIT  CHIEF COMPLAINT: Vertebrobasilar ischemia.  Current Pain Level: 1-10  HISTORY OF PRESENT ILLNESS: Patient is a 79 year old, right-handed gentleman who has been referred for evaluation of vertebrobasilar ischemia due to significant bilateral vertebrobasilar cerebral vascular disease. The patient is accompanied by his son.  According to them, the patient apparently woke up on May 3rd with symptoms of severe dizziness, vertigo, gait disturbance associated with nausea and visual blurring.  This prompted a visit to her primary care doctor where an MRI of the brain was ordered. This demonstrated a right-sided posterior-inferior cerebellar artery ischemic infarct. CT angiogram was then performed a day later which revealed significant  cerebrovascular extracranial disease, with tight severe stenosis of the dominant right vertebral artery at its origin, and to a lesser degree of the left vertebral artery at its origin.  Over the interim, the patient's symptoms have somewhat receded though not completely gone away. He still complains of some difficulty with balance.  He denies any symptoms of swallowing difficulties, vertigo, hearing difficulties, double vision, blindness in one or both eyes, loss of consciousness, or seizures.  He says his balance is somewhat improved though not back to normal.  He denies any speech difficulties.  Past Medical History: Significant for diabetes treated with diet control. High blood pressure. Patient has a history of bilateral knee replacement approximately 2-3 years ago without any problems.  Medications: Norvasc.  Atenolol.  Betamethasone cream.  Lisinopril.  Allergies: Sulfa drugs and bee stings, which cause the patient to develop lip and throat swelling.  Social History: Divorced. Children alive and well. Drinks 20 beers a week. Stopped smoking in 1991. Denies any use of illicit chemicals.  Family History: Brain aneurysm in a son. Son with a  stroke. Mother died of carcinoma GI tract. Father died of lung cancer at age 68.  REVIEW OF SYSTEMS: Essentially negative for pathologic symptomatology.  PHYSICAL EXAMINATION: In no acute distress. Affect normal. Speech and comprehension within normal limits. No lateralizing cranial nerve abnormalities. No gross motor or sensory difficulties.  ASSESSMENT AND PLAN: Patient's recent MRI of the brain and CT angiogram of the brain and neck were reviewed with the patient and patient's son. These depict a right posterior-inferior cerebellar artery ischemic infarct. Also CT angiogram reveals a severe stenosis related to a calcified plaque at the origin of the dominant right vertebral artery. There is a suggestion of a possible significant stenoses at the origin of the left vertebral  artery. Moderate atherosclerotic calcific disease is seen in both carotid artery bifurcations.  The patient was advised that a formal catheter angiogram will be needed to accurately elucidate the degree of the narrowing at the origins of the vertebral arteries and also to assess the narrowing of the carotid bifurcations.  The procedure, the risks, the benefits and alternatives were all reviewed in detail. Additionally the patient would probably benefit by the addition of a second anti-platelet in the form of Plavix 75 mg a day given his significant cerebrovascular symptomatic disease.  A prescription was given to him.  Both the patient and his son are in agreement to proceed with a formal catheter angiogram. This will be undertaken as soon as possible. They were asked to call should they have any questions or concerns.   Electronically Signed   By: Derrick Figueroa M.D.   On: 01/27/2015 14:31    Labs:  CBC:  Recent Labs  02/10/15 0930  WBC 5.4  HGB 13.6  HCT 38.4*  PLT 190    COAGS: No results for input(s): INR, APTT in the last 8760 hours.  BMP: No results for input(s): NA, K, CL, CO2, GLUCOSE, BUN, CALCIUM, CREATININE, GFRNONAA, GFRAA in the last 8760 hours.  Invalid input(s): CMP  LIVER FUNCTION TESTS: No results for input(s): BILITOT, AST, ALT, ALKPHOS, PROT, ALBUMIN in the last 8760 hours.  TUMOR MARKERS: No results for input(s): AFPTM, CEA, CA199, CHROMGRNA in the last 8760 hours.  Assessment and Plan:  CVA B VA stenosis Scheduled for cerebral arteriogram Risks and Benefits discussed with the patient including, but not limited to bleeding, infection, vascular injury, contrast induced renal failure, stroke or even death. All of the patient's questions were answered, patient is agreeable to proceed. Consent signed and in chart.  Thank you for this interesting consult.  I greatly enjoyed meeting Cadell Gabrielson and look forward to participating in their  care.  Signed: Lucio Figueroa A 02/10/2015, 10:10 AM   I spent a total of  20 Minutes   in face to face in clinical consultation, greater than 50% of which was counseling/coordinating care for cerebral arteriogram

## 2015-02-13 ENCOUNTER — Ambulatory Visit (HOSPITAL_COMMUNITY): Admission: RE | Admit: 2015-02-13 | Payer: Medicare Other | Source: Ambulatory Visit

## 2015-02-17 ENCOUNTER — Ambulatory Visit (HOSPITAL_COMMUNITY): Admission: RE | Admit: 2015-02-17 | Payer: Medicare Other | Source: Ambulatory Visit

## 2015-07-14 DEATH — deceased

## 2015-07-28 ENCOUNTER — Telehealth (HOSPITAL_COMMUNITY): Payer: Self-pay

## 2015-07-28 NOTE — Telephone Encounter (Signed)
Called pt to reschedule f/u, both phones were not working. Tried to call pt's son, left a VM for him to call back. AW
